# Patient Record
Sex: Female | Born: 1980 | Race: White | Hispanic: Yes | Marital: Married | Smoking: Never smoker
Health system: Southern US, Community
[De-identification: ages and names within clinical notes are randomized; demographics above are authoritative.]

## PROBLEM LIST (undated history)

## (undated) DIAGNOSIS — B977 Papillomavirus as the cause of diseases classified elsewhere: Secondary | ICD-10-CM

## (undated) HISTORY — DX: Papillomavirus as the cause of diseases classified elsewhere: B97.7

---

## 2002-07-18 ENCOUNTER — Encounter: Payer: Self-pay | Admitting: Emergency Medicine

## 2002-07-18 ENCOUNTER — Emergency Department (HOSPITAL_COMMUNITY): Admission: EM | Admit: 2002-07-18 | Discharge: 2002-07-18 | Payer: Self-pay | Admitting: Emergency Medicine

## 2002-07-23 ENCOUNTER — Emergency Department (HOSPITAL_COMMUNITY): Admission: EM | Admit: 2002-07-23 | Discharge: 2002-07-24 | Payer: Self-pay | Admitting: Emergency Medicine

## 2004-05-20 ENCOUNTER — Inpatient Hospital Stay (HOSPITAL_COMMUNITY): Admission: AD | Admit: 2004-05-20 | Discharge: 2004-05-20 | Payer: Self-pay | Admitting: Obstetrics & Gynecology

## 2004-08-20 ENCOUNTER — Ambulatory Visit (HOSPITAL_COMMUNITY): Admission: RE | Admit: 2004-08-20 | Discharge: 2004-08-20 | Payer: Self-pay | Admitting: Obstetrics & Gynecology

## 2004-08-25 ENCOUNTER — Ambulatory Visit (HOSPITAL_COMMUNITY): Admission: RE | Admit: 2004-08-25 | Discharge: 2004-08-25 | Payer: Self-pay | Admitting: *Deleted

## 2004-08-25 ENCOUNTER — Ambulatory Visit: Payer: Self-pay | Admitting: *Deleted

## 2004-10-20 ENCOUNTER — Ambulatory Visit (HOSPITAL_COMMUNITY): Admission: RE | Admit: 2004-10-20 | Discharge: 2004-10-20 | Payer: Self-pay | Admitting: *Deleted

## 2004-10-22 ENCOUNTER — Other Ambulatory Visit: Admission: RE | Admit: 2004-10-22 | Discharge: 2004-10-22 | Payer: Self-pay | Admitting: Obstetrics and Gynecology

## 2004-10-22 ENCOUNTER — Encounter (INDEPENDENT_AMBULATORY_CARE_PROVIDER_SITE_OTHER): Payer: Self-pay | Admitting: Specialist

## 2004-10-22 ENCOUNTER — Ambulatory Visit: Payer: Self-pay | Admitting: Family Medicine

## 2004-11-05 ENCOUNTER — Ambulatory Visit: Payer: Self-pay | Admitting: Family Medicine

## 2005-01-15 ENCOUNTER — Ambulatory Visit (HOSPITAL_COMMUNITY): Admission: RE | Admit: 2005-01-15 | Discharge: 2005-01-15 | Payer: Self-pay | Admitting: *Deleted

## 2005-01-15 ENCOUNTER — Ambulatory Visit: Payer: Self-pay | Admitting: *Deleted

## 2005-01-19 ENCOUNTER — Ambulatory Visit: Payer: Self-pay | Admitting: Family Medicine

## 2005-01-20 ENCOUNTER — Inpatient Hospital Stay (HOSPITAL_COMMUNITY): Admission: AD | Admit: 2005-01-20 | Discharge: 2005-01-24 | Payer: Self-pay | Admitting: Obstetrics and Gynecology

## 2005-01-20 ENCOUNTER — Ambulatory Visit: Payer: Self-pay | Admitting: Obstetrics & Gynecology

## 2005-01-21 ENCOUNTER — Encounter (INDEPENDENT_AMBULATORY_CARE_PROVIDER_SITE_OTHER): Payer: Self-pay | Admitting: *Deleted

## 2005-01-25 DIAGNOSIS — B977 Papillomavirus as the cause of diseases classified elsewhere: Secondary | ICD-10-CM

## 2005-01-25 HISTORY — DX: Papillomavirus as the cause of diseases classified elsewhere: B97.7

## 2005-03-11 ENCOUNTER — Ambulatory Visit: Payer: Self-pay | Admitting: Family Medicine

## 2005-04-05 ENCOUNTER — Emergency Department (HOSPITAL_COMMUNITY): Admission: EM | Admit: 2005-04-05 | Discharge: 2005-04-05 | Payer: Self-pay | Admitting: Pediatrics

## 2005-06-17 ENCOUNTER — Ambulatory Visit: Payer: Self-pay | Admitting: Obstetrics and Gynecology

## 2005-06-17 ENCOUNTER — Encounter: Payer: Self-pay | Admitting: Obstetrics and Gynecology

## 2005-06-30 ENCOUNTER — Encounter (INDEPENDENT_AMBULATORY_CARE_PROVIDER_SITE_OTHER): Payer: Self-pay | Admitting: *Deleted

## 2005-06-30 ENCOUNTER — Ambulatory Visit: Payer: Self-pay | Admitting: Obstetrics and Gynecology

## 2005-07-01 ENCOUNTER — Other Ambulatory Visit: Admission: RE | Admit: 2005-07-01 | Discharge: 2005-07-01 | Payer: Self-pay | Admitting: Obstetrics and Gynecology

## 2005-07-14 ENCOUNTER — Ambulatory Visit: Payer: Self-pay | Admitting: Obstetrics & Gynecology

## 2006-11-27 LAB — CONVERTED CEMR LAB: Pap Smear: NORMAL

## 2007-01-01 ENCOUNTER — Emergency Department (HOSPITAL_COMMUNITY): Admission: EM | Admit: 2007-01-01 | Discharge: 2007-01-01 | Payer: Self-pay | Admitting: Emergency Medicine

## 2007-06-22 ENCOUNTER — Ambulatory Visit: Payer: Self-pay | Admitting: Nurse Practitioner

## 2007-06-22 DIAGNOSIS — K2990 Gastroduodenitis, unspecified, without bleeding: Secondary | ICD-10-CM

## 2007-06-22 DIAGNOSIS — K2970 Gastritis, unspecified, without bleeding: Secondary | ICD-10-CM | POA: Insufficient documentation

## 2007-06-23 ENCOUNTER — Ambulatory Visit: Payer: Self-pay | Admitting: *Deleted

## 2009-12-12 ENCOUNTER — Emergency Department (HOSPITAL_COMMUNITY): Admission: EM | Admit: 2009-12-12 | Discharge: 2009-12-12 | Payer: Self-pay | Admitting: Emergency Medicine

## 2010-02-15 ENCOUNTER — Encounter: Payer: Self-pay | Admitting: *Deleted

## 2010-06-12 NOTE — Group Therapy Note (Signed)
Gabriella Schultz, GREENLY NO.:  [id number]   MEDICAL RECORD NO.:  [id number]          PATIENT TYPE:  WOC   LOCATION:  [facility]                   FACILITY:  [facility]   PHYSICIAN:  Argentina Donovan, MD        DATE OF BIRTH:  07-17-1980   DATE OF SERVICE:  06/30/2005                                    CLINIC NOTE   Patient is a 30 year old Hispanic female who was scheduled for LEEP  conization of the cervix after a previous vision, consults, consulting her  and having her watch a moving and discussing the possible side effects of  the procedure.  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   The procedure was done ___________ with the patient in a dorsal lithotomy  position.  The insulated speculum was inserted into the vagina and the  cervix placed in the mid portion of the visual field.  The lesion on the  cervix is extremely small on the upper lip, measuring a few millimeters in  diameter.  I thought this would be easy to do with a 1 x 1 cm loop.  Using a  blending 1 current of 58, a LEEP specimen was taken.  The area around the  specimen area was then re-coagulated with hot cautery measured at 50 watts.  There was no bleeding noted.  The speculum was removed, and the patient was  discharged.   Pending the pathology report, she will return in 2 weeks.  We have counseled  her to avoid tampons and sex until that time.           ______________________________  Argentina Donovan, MD     PR/MEDQ  D:  06/30/2005  T:  07/01/2005  Job:  [id number]

## 2010-06-12 NOTE — Op Note (Signed)
NAMELABERTA, WILBON       ACCOUNT NO.:  [id number]   MEDICAL RECORD NO.:  [id number]          PATIENT TYPE:  INP   LOCATION:  9371                          [facility]   PHYSICIAN:  Lesly Dukes, M.D. DATE OF BIRTH:  18-Oct-1980   DATE OF PROCEDURE:  01/21/2005  DATE OF DISCHARGE:                                 OPERATIVE REPORT   PREOPERATIVE DIAGNOSIS:  A 30 year old, gravida 2, para 0-0-1-0, at 40 weeks  and 6 days estimated gestational age with failure to progress.   POSTOPERATIVE DIAGNOSIS:  A 30 year old, gravida 2, para 0-0-1-0, at 40  weeks and 6 days estimated gestational age with failure to progress, occiput  transverse on the right side of the maternal pelvis and uterine atony.   OPERATION/PROCEDURE:  Primary low flap transverse cesarean section.   SURGEON:  Lesly Dukes, M.D.   ASSISTANT:  Towana Badger, M.D.   ANESTHESIA:  Epidural.   SPECIMENS:  Placenta.   ESTIMATED BLOOD LOSS:  800 mL.   COMPLICATIONS:  None.   FINDINGS:  A viable infant, vertex, clear fluid.  Grossly normal uterus,  ovaries and fallopian tube.  NICU team at delivery.   DESCRIPTION OF PROCEDURE:  After informed consent was obtained, the patient  was taken to the operating room where epidural anesthesia was found to be  adequate.  The patient was placed in the dorsal supine position with a  leftward tilt and prepared and draped in the normal sterile fashion.   A Pfannenstiel skin incision was made with the scalpel and carried down to  the fascia.  The fascia was incised in the midline and this incision was  extended bilaterally with the Mayo scissors.  The superior and inferior  aspects of the fascial incision were grasped with Kocher clamps, tented up,  and dissected off sharply and bluntly from underlying layers of rectus  muscles.  The rectus muscles were separated in the midline.  The peritoneum  was entered identified, tented up and entered sharply with Metzenbaum  scissors.  This incision was extended superiorly and inferiorly with good  visualization of the bladder.  The bladder blade was inserted.  The  vesicouterine peritoneum was identified, tented up and entered sharply with  the Metzenbaum scissors.  This incision was extended bilaterally and the  bladder flap was created digitally.  Bladder blade was reinserted.  The  uterine incision was made in a transverse fashion in the lower uterine  segment with a scalpel and extended bluntly, superiorly and laterally.  The  baby's head was delivered atraumatically and nose and mouth were suctioned.  The rest of the baby's body delivered easily.  The cord was clamped and cut  and the baby was handed off to the waiting pediatrician.  Cord blood was  sent for type and screen.  Placenta delivered manually and the uterus was  cleared of all clots and debris.  There was significant uterine atony and  uterus was removed from the abdomen and placed on tension. Vigorous fundal  massage and Pitocin was infused.  The uterine incision was closed with 0  Vicryl in a running locked fashion.  There  was still uterine atony.  Hemabate 250 mcg injected into the uterus and 1000 mcg of Cytotec was placed  in the rectum which helped aid in __________ uterine retraction.  Uterus was  returned to the abdomen and noted to be hemostatic off tension.  There was  very little bleeding vaginally.  At this time it was decided to close.  The  rectus muscles and peritoneum were noted to be hemostatic.  The fascia was  closed with 0 Vicryl in a running fashion.  The subcutaneous tissue was  copiously irrigated and all layers were hemostatic.  The skin was closed  with staples and a bandage was placed on the abdomen.  The patient tolerated  the procedure well.  Sponge, lab, instrument, and needle counts were x2.  The patient went to the recovery room in stable condition.           ______________________________  Lesly Dukes,  M.D.     KHL/MEDQ  D:  01/21/2005  T:  01/22/2005  Job:  [id number]

## 2010-06-12 NOTE — Discharge Summary (Signed)
Gabriella Schultz, Gabriella Schultz       ACCOUNT NO.:  [id number]   MEDICAL RECORD NO.:  [id number]          PATIENT TYPE:  WOC   LOCATION:  W[facility]                          FACILITY:  [facility]   PHYSICIAN:  Lesly Dukes, M.D. DATE OF BIRTH:  Apr 21, 1980   DATE OF ADMISSION:  01/19/2005  DATE OF DISCHARGE:  01/24/2005                                 DISCHARGE SUMMARY   DISCHARGE DIAGNOSES:  1.  Term pregnancy, delivered.  2.  Preeclampsia.  3.  Postpartum fever.   OPERATION/PROCEDURE:  Primary low transverse cesarean section, January 21, 2005.   DISCHARGE MEDICATIONS:  1.  Percocet one to two q.4h. p.r.n. severe pain #30 with no refill.  2.  Ibuprofen 600 mg q.4-6h. p.r.n. for pain.  3.  Micronor one p.o. daily to start February 07, 2005.  4.  Prenatal vitamins one p.o. daily for six weeks.   FOLLOW UP:  At [facility] in six weeks.   BRIEF HISTORY AND HOSPITAL COURSE:  Gabriella Schultz is a 30 year old,  gravida 2, para 1-0-1-1, admitted January 20, 2005 at 40 weeks and 5 days  in very early labor with high blood pressure and positive PIH labs.  She was  admitted for augmentation with Pitocin and magnesium sulfate drip.  She was  laboring with progression to cervical dilatation of 5-6 cm even on high-dose  Pitocin. She failed to progress and was taken for cesarean section January 21, 2005.  Postpartum she was admitted to the [facility] on magnesium sulfate  therapy which was discontinued on postoperative day #2 after significant  diuresis and stable blood pressures.  On postoperative day #1, she had a  fever to 101 and was started on gentamicin and clindamycin and ampicillin  which were all discontinued.  After two days of treatment, she was  discharged in good and stable condition on January 24, 2005, having been  afebrile for greater than 24 hours and off antibiotics for 12 hours. She had  some mild abdominal tenderness which went away with Percocet and was given  precautions and  understood that if febrile, increased pain, or pain that did  not go away with pain medications, or abdominal symptoms like vomiting, she  was to return to the [facility].  She understands.  Staples were removed prior to  discharge.  She is to follow up as above.      Ace Gins, MD    ______________________________  Lesly Dukes, M.D.    JS/MEDQ  D:  01/24/2005  T:  01/25/2005  Job:  [id number]

## 2010-06-12 NOTE — Group Therapy Note (Signed)
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  We have talked to her about  the treatments.  She has had one five months old base cesarean section and  has never had an abnormal Pap smear as far as she knew until she was  pregnant, which I think gives concern considering that she is CIN-3 at this  point.  The cervix is examined and she is found to have a very tiny lesion  which I think could easily be handled with a rather shallow small LEEP.  We  are going to show the patient the film being translated by [place], and  schedule her for a LEEP biopsy in the near future.   The patient is taking Depo-Provera.  She is a nonsmoker.           ______________________________  Argentina Donovan, MD     PR/MEDQ  D:  06/17/2005  T:  06/18/2005  Job:  [id number]

## 2010-06-12 NOTE — Group Therapy Note (Signed)
NAMEBLONNIE, MASKE NO.:  [id number]   MEDICAL RECORD NO.:  [id number]          PATIENT TYPE:  WOC   LOCATION:  [facility]                   [facility]   PHYSICIAN:  Elsie Lincoln, MD      DATE OF BIRTH:  July 30, 1980   DATE OF SERVICE:  07/14/2005                                    CLINIC NOTE   The patient is a 30 year old female who presented for care for follow up of  her LEEP. She had a LEEP done on June 30, 2005, for CIN-3. 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 The patient has had some cramping since.   PHYSICAL EXAMINATION:  On physical exam the cervix looks like it is healing  well. It is painful when I put the speculum, but the speculum was painful  before the procedure. She denies any fevers or abnormal-smelling discharge.   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 The patient needs  every six month Pap smear for a year, and then yearly Pap smears after that.           ______________________________  Elsie Lincoln, MD     KL/MEDQ  D:  07/14/2005  T:  07/14/2005  Job:  [id number]

## 2010-11-02 LAB — COMPREHENSIVE METABOLIC PANEL
ALT: 50 — ABNORMAL HIGH
AST: 49 — ABNORMAL HIGH
Albumin: 4.4
Alkaline Phosphatase: 60
BUN: 7
CO2: 27
Calcium: 9.6
Chloride: 103
Creatinine, Ser: 0.43
GFR calc Af Amer: >60
GFR calc non Af Amer: >60
Glucose, Bld: 104 — ABNORMAL HIGH
Potassium: 3.9
Sodium: 139
Total Bilirubin: 0.8
Total Protein: 7.7

## 2010-11-02 LAB — DIFFERENTIAL
Basophils Absolute: 0.1
Basophils Relative: 0
Eosinophils Absolute: 0.1 — ABNORMAL LOW
Eosinophils Relative: 0
Lymphocytes Relative: 12
Lymphs Abs: 1.8
Monocytes Absolute: 0.8
Monocytes Relative: 6
Neutro Abs: 12.2 — ABNORMAL HIGH
Neutrophils Relative %: 82 — ABNORMAL HIGH

## 2010-11-02 LAB — CBC
HCT: 40.6
Hemoglobin: 13.5
MCHC: 33.2
MCV: 86.8
Platelets: 293
RBC: 4.68
RDW: 14.1
WBC: 14.9 — ABNORMAL HIGH

## 2010-11-02 LAB — LIPASE, BLOOD: Lipase: 27

## 2013-09-23 ENCOUNTER — Ambulatory Visit (INDEPENDENT_AMBULATORY_CARE_PROVIDER_SITE_OTHER): Payer: Self-pay | Admitting: Internal Medicine

## 2013-09-23 VITALS — BP 108/70 | HR 77 | Temp 98.4°F | Resp 18 | Ht 59.0 in | Wt 141.0 lb

## 2013-09-23 DIAGNOSIS — R12 Heartburn: Secondary | ICD-10-CM

## 2013-09-23 DIAGNOSIS — J351 Hypertrophy of tonsils: Secondary | ICD-10-CM

## 2013-09-23 LAB — POCT CBC
Granulocyte percent: 63.3 %G (ref 37–80)
HCT, POC: 39.1 % (ref 37.7–47.9)
HEMOGLOBIN: 12.3 g/dL (ref 12.2–16.2)
LYMPH, POC: 2.7 (ref 0.6–3.4)
MCH, POC: 27 pg (ref 27–31.2)
MCHC: 31.4 g/dL — AB (ref 31.8–35.4)
MCV: 86.1 fL (ref 80–97)
MID (CBC): 0.5 (ref 0–0.9)
MPV: 7.6 fL (ref 0–99.8)
PLATELET COUNT, POC: 314 10*3/uL (ref 142–424)
POC GRANULOCYTE: 5.6 (ref 2–6.9)
POC LYMPH PERCENT: 30.6 %L (ref 10–50)
POC MID %: 6.1 % (ref 0–12)
RBC: 4.54 M/uL (ref 4.04–5.48)
RDW, POC: 15.1 %
WBC: 8.8 10*3/uL (ref 4.6–10.2)

## 2013-09-23 LAB — GLUCOSE, POCT (MANUAL RESULT ENTRY): POC GLUCOSE: 113 mg/dL — AB (ref 70–99)

## 2013-09-23 MED ORDER — PREDNISONE 10 MG PO TABS: ORAL_TABLET | ORAL | Status: AC

## 2013-09-23 MED ORDER — LANSOPRAZOLE 30 MG PO CPDR: 30.0000 mg | DELAYED_RELEASE_CAPSULE | Freq: Every day | ORAL | Status: DC

## 2013-09-23 MED ORDER — RANITIDINE HCL 300 MG PO TABS: 300.0000 mg | ORAL_TABLET | Freq: Every day | ORAL | Status: DC

## 2013-09-23 NOTE — Progress Notes (Signed)
Subjective:    Patient ID: Gabriella Schultz, female    DOB: [birth year], 33 y.o.   MRN: [id number]  HPI Pt presents to clinic with sore throat in the am that gets only slightly better during the day.  She has had heartburn really bad for the last month - she had it in the past but had gotten rid of it with weight loss but then she gained weight and the heartburn is back - it only affects her when she lays down at night.  She feels like something is stuck in her throat but she only has minor congestion without knowledge of a lot of PND.  She does not feel sick.  Her husband says she snores but he has never told her she stops breathing.  She is having no trouble breathing at this time.  Her throat is making her talk funny esp at work when she has to talk a lot.  OTC - zrytec  Review of Systems  Constitutional: Negative for fever and chills.  HENT: Positive for sore throat. Negative for congestion.   Respiratory: Positive for cough.   Allergic/Immunologic: Positive for environmental allergies.       Objective:   Physical Exam  Vitals reviewed. Constitutional: She is oriented to person, place, and time. She appears well-developed and well-nourished.  HENT:  Head: Normocephalic and atraumatic.  Right Ear: Hearing, tympanic membrane, external ear and ear canal normal.  Left Ear: Hearing, tympanic membrane, external ear and ear canal normal.  Nose: Nose normal.  Mouth/Throat: Posterior oropharyngeal edema (kissing tonsils without erythema or exudate) present.  Eyes: Conjunctivae are normal.  Neck: Normal range of motion.  Cardiovascular: Normal rate, regular rhythm and normal heart sounds.   No murmur heard. Pulmonary/Chest: Effort normal and breath sounds normal. She has no wheezes.  Lymphadenopathy:       Head (right side): No preauricular and no occipital adenopathy present.       Head (left side): No preauricular and no occipital adenopathy present.    She has cervical adenopathy  (enlarged by no TTP).       Right cervical: Superficial cervical adenopathy present.       Left cervical: Superficial cervical adenopathy present.       Right: No inguinal adenopathy present.       Left: No inguinal adenopathy present.  Neurological: She is alert and oriented to person, place, and time.  Skin: Skin is warm and dry.  Psychiatric: She has a normal mood and affect. Her behavior is normal. Judgment and thought content normal.      Assessment & Plan:  Enlarged tonsils - Plan: POCT CBC, POCT glucose (manual entry), predniSONE (DELTASONE) 10 MG tablet  Heartburn - Plan: ranitidine (ZANTAC) 300 MG tablet, lansoprazole (PREVACID) 30 MG capsule  Pt has very enlarged tonsils - unsure if they are acutely enlarged due to reflux or if they are chronically enlarged or a combination of both. She will talk with her husband about her snoring and possible apnea.  She will recheck with me in 4 days as long as she is improving to make sure she has improved with prednisone and if not she will need referral for ENT evaluation.  We will treat with reflux medication for the next month if she improves with the prednisone.  Benny Lennert PA-C  [facility] 09/23/2013 4:53 PM  Evaluated this patient with PA Chanan Detwiler and agree with diagnosis and plan. Robert P. Sandria Bales.D.

## 2013-09-26 ENCOUNTER — Ambulatory Visit: Payer: Self-pay | Attending: Internal Medicine

## 2013-11-19 ENCOUNTER — Other Ambulatory Visit: Payer: Self-pay | Admitting: Physician Assistant

## 2013-11-19 DIAGNOSIS — J351 Hypertrophy of tonsils: Secondary | ICD-10-CM

## 2013-11-20 NOTE — Telephone Encounter (Signed)
Gabriella Schultz had wanted pt to be back in touch w/her concerning status/need for referral to ENT for enlarged tonsils/reflux. LMOM to CB. Has reflux resolved w/prevacid?

## 2013-11-21 NOTE — Telephone Encounter (Signed)
LMOM to CB. 

## 2013-11-26 NOTE — Telephone Encounter (Signed)
Spoke w/pt who stated she would call me back later today to discuss since she is working now, but she reported that her acid reflux has improved during the day, but still bothers her at night. She tries to eat a light dinner. She stated that her tonsils are still enlarged.

## 2013-11-30 NOTE — Telephone Encounter (Signed)
Pt has not called back.LMOM for pt asking for her to call back.

## 2013-12-03 NOTE — Telephone Encounter (Signed)
Pt came into 102 and advised that her acid is gone and she doesn't need a refill, but she is still having problem w/enlarged tonsils. She would like a referral to ENT IF it is part of [facility] bc she has 100% discount through [facility] [employer] assist. I will put in referral w/this note. According to website, [facility] is [facility]. Lupita LeashDonna, can you please make sure you refer pt there and make sure that they will accept the [facility] 100% Financial Aid? Clare GandySarah, FYI.

## 2014-03-08 ENCOUNTER — Ambulatory Visit: Payer: Self-pay | Admitting: Family Medicine

## 2014-03-18 ENCOUNTER — Ambulatory Visit: Payer: Self-pay | Attending: Family Medicine | Admitting: Family Medicine

## 2014-03-18 ENCOUNTER — Encounter: Payer: Self-pay | Admitting: Family Medicine

## 2014-03-18 VITALS — BP 129/88 | HR 63 | Temp 98.1°F | Resp 16 | Ht 60.0 in | Wt 140.0 lb

## 2014-03-18 DIAGNOSIS — Z01419 Encounter for gynecological examination (general) (routine) without abnormal findings: Secondary | ICD-10-CM

## 2014-03-18 DIAGNOSIS — Z8349 Family history of other endocrine, nutritional and metabolic diseases: Secondary | ICD-10-CM | POA: Insufficient documentation

## 2014-03-18 DIAGNOSIS — Z124 Encounter for screening for malignant neoplasm of cervix: Secondary | ICD-10-CM | POA: Insufficient documentation

## 2014-03-18 DIAGNOSIS — R7309 Other abnormal glucose: Secondary | ICD-10-CM | POA: Insufficient documentation

## 2014-03-18 DIAGNOSIS — J351 Hypertrophy of tonsils: Secondary | ICD-10-CM | POA: Insufficient documentation

## 2014-03-18 DIAGNOSIS — K219 Gastro-esophageal reflux disease without esophagitis: Secondary | ICD-10-CM | POA: Insufficient documentation

## 2014-03-18 DIAGNOSIS — Z131 Encounter for screening for diabetes mellitus: Secondary | ICD-10-CM

## 2014-03-18 DIAGNOSIS — Z83438 Family history of other disorder of lipoprotein metabolism and other lipidemia: Secondary | ICD-10-CM | POA: Insufficient documentation

## 2014-03-18 DIAGNOSIS — Z224 Carrier of infections with a predominantly sexual mode of transmission: Secondary | ICD-10-CM | POA: Insufficient documentation

## 2014-03-18 LAB — TSH: TSH: 1.338 u[IU]/mL (ref 0.350–4.500)

## 2014-03-18 LAB — GLUCOSE, POCT (MANUAL RESULT ENTRY): POC GLUCOSE: 97 mg/dL (ref 70–99)

## 2014-03-18 LAB — POCT GLYCOSYLATED HEMOGLOBIN (HGB A1C): Hemoglobin A1C: 6.2

## 2014-03-18 MED ORDER — RANITIDINE HCL 300 MG PO TABS: 300.0000 mg | ORAL_TABLET | Freq: Every day | ORAL | Status: AC

## 2014-03-18 NOTE — Assessment & Plan Note (Addendum)
F/u fasting for lipids.

## 2014-03-18 NOTE — Progress Notes (Signed)
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

## 2014-03-18 NOTE — Assessment & Plan Note (Signed)
cervical cancer screening: pap smear done today.

## 2014-03-18 NOTE — Progress Notes (Signed)
   Subjective:    Patient ID: Gabriella Schultz, female    DOB: 02/18/1980, 34 y.o.   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 Swollen tonsils: x 6 months. No fever, cough, ear ache.  Intermittent soreness. Hx of GERD on zantac most recently. Also treated with PPI in the past.   2. Due for pap: has hx of HPV positive pap and colposcopy. Currently on menstrual period. Has cramping during period but no other times.    Soc Hx: non smoker  Surg Hx: s/p C-section in  Fam Hx: HLD in mother and sister  Review of Systems As per HPI     Objective:   Physical Exam BP 129/88 mmHg  Pulse 63  Temp(Src) 98.1 F (36.7 C)  Resp 16  Ht 5' (1.524 m)  Wt 140 lb (63.504 kg)  BMI 27.34 kg/m2  SpO2 100%  LMP 03/18/2014 General appearance: alert, cooperative and no distress Head: Normocephalic, without obvious abnormality, atraumatic Eyes: conjunctivae/corneas clear. PERRL, EOM's intact.  Ears: normal TM's and external ear canals both ears Throat: normal findings: lips normal without lesions, buccal mucosa normal, gums healthy, teeth intact, non-carious, palate normal, tongue midline and normal and oropharynx pink & moist without lesions or evidence of thrush and abnormal findings: tonsillar hypertrophy 4+ Neck: mild anterior cervical adenopathy, supple, symmetrical, trachea midline and thyroid not enlarged, symmetric, no tenderness/mass/nodules Lungs: clear to auscultation bilaterally Breasts: normal appearance, no masses or tenderness, Inspection negative, No nipple retraction or dimpling, No nipple discharge or bleeding, No axillary or supraclavicular adenopathy Heart: regular rate and rhythm, S1, S2 normal, no murmur, click, rub or gallop Pelvic: cervix normal in appearance, external genitalia normal, no adnexal masses or tenderness, no cervical motion tenderness, positive  findings: vaginal discharge:  bloody, rectovaginal septum normal and uterus normal size, shape, and consistency Extremities: extremities normal, atraumatic, no cyanosis or edema Skin: Skin color, texture, turgor normal. No rashes or lesions  Lab Results  Component Value Date   HGBA1C 6.20 03/18/2014   CBG 97     Assessment & Plan:

## 2014-03-18 NOTE — Assessment & Plan Note (Addendum)
A:  Swollen tonsils: rapid strep negative.  Will send culture. Possibly due to reflux Swollen tonsils is not associated with a thyroid problem.  P: Restart zantac 300 mg at night.

## 2014-03-18 NOTE — Patient Instructions (Addendum)
Gabriella Schultz,   Thank you for coming in today. It was a pleasure meeting you. I look forward to being your primary doctor.   1. Cervical cancer screening: pap smear done today.   2. Swollen tonsils: rapid strep negative.  Will send culture. Possibly due to reflux Swollen tonsils is not associated with a thyroid problem.   Plan: Restart zantac 300 mg at night.   F/u fasting for lipids.  A1c is 6.2 close to diabetes. Start low carb diet, exercise 3-4 times weekly, aim to lose 10-15# of weight over the next 3 months.   F/u in 6 weeks for swollen tonsils. If still swollen despite zantac 300 mg nightly ENT referral.   Please apply for [facility] discount and orange card, you can also inquire if any of your medications are on the PASS (medications assistance) list.   Dr. Armen PickupFunches

## 2014-03-18 NOTE — Assessment & Plan Note (Signed)
A1c is 6.2 close to diabetes. Start low carb diet, exercise 3-4 times weekly, aim to lose 10-15# of weight over the next 3 months.

## 2014-03-19 LAB — CERVICOVAGINAL ANCILLARY ONLY
CHLAMYDIA, DNA PROBE: NEGATIVE
NEISSERIA GONORRHEA: NEGATIVE
WET PREP (BD AFFIRM): NEGATIVE
WET PREP (BD AFFIRM): NEGATIVE
Wet Prep (BD Affirm): NEGATIVE

## 2014-03-19 LAB — CYTOLOGY - PAP

## 2014-03-20 ENCOUNTER — Ambulatory Visit: Payer: Self-pay | Attending: Family Medicine

## 2014-03-20 DIAGNOSIS — Z83438 Family history of other disorder of lipoprotein metabolism and other lipidemia: Secondary | ICD-10-CM

## 2014-03-20 LAB — LIPID PANEL
CHOL/HDL RATIO: 4.3 ratio
CHOLESTEROL: 220 mg/dL — AB (ref 0–200)
HDL: 51 mg/dL (ref >=46)
LDL Cholesterol: 150 mg/dL — ABNORMAL HIGH (ref 0–99)
TRIGLYCERIDES: 95 mg/dL (ref <150)
VLDL: 19 mg/dL (ref 0–40)

## 2014-03-20 LAB — CULTURE, BETA STREP (GROUP B ONLY)

## 2014-03-21 ENCOUNTER — Encounter: Payer: Self-pay | Admitting: Family Medicine

## 2014-03-21 ENCOUNTER — Telehealth: Payer: Self-pay | Admitting: *Deleted

## 2014-03-21 NOTE — Telephone Encounter (Signed)
-----   Message from Lora PaulaJosalyn C Funches, MD sent at 03/20/2014 12:24 PM EST ----- Negative strep culture

## 2014-03-21 NOTE — Telephone Encounter (Signed)
Pt aware of lab results 

## 2014-03-21 NOTE — Telephone Encounter (Signed)
-----   Message from Lora PaulaJosalyn C Funches, MD sent at 03/19/2014  9:11 AM EST ----- Normal TSH. Normal thyroid. Normal wet prep. Neg Gc/chlam

## 2014-03-21 NOTE — Telephone Encounter (Signed)
-----   Message from Josalyn C Funches, MD sent at 03/20/2014 12:24 PM EST ----- Negative strep culture 

## 2014-03-22 ENCOUNTER — Telehealth: Payer: Self-pay | Admitting: *Deleted

## 2014-03-22 NOTE — Telephone Encounter (Signed)
Pt aware of lab results 

## 2014-03-22 NOTE — Telephone Encounter (Signed)
-----   Message from Lora PaulaJosalyn C Funches, MD sent at 03/22/2014 10:01 AM EST ----- Elevated LDL and total cholestol Patient low risk for CAD Recommend low fat, low carb diet, exercise with goal of 5-10 # weight loss

## 2014-04-10 ENCOUNTER — Ambulatory Visit: Payer: Self-pay | Attending: Family Medicine

## 2014-08-19 ENCOUNTER — Encounter: Payer: Self-pay | Admitting: Family Medicine

## 2014-08-19 ENCOUNTER — Ambulatory Visit: Payer: Self-pay | Attending: Family Medicine | Admitting: Family Medicine

## 2014-08-19 VITALS — BP 119/82 | HR 63 | Temp 98.5°F | Resp 18 | Ht 61.0 in | Wt 140.0 lb

## 2014-08-19 DIAGNOSIS — G5601 Carpal tunnel syndrome, right upper limb: Secondary | ICD-10-CM | POA: Insufficient documentation

## 2014-08-19 DIAGNOSIS — G5600 Carpal tunnel syndrome, unspecified upper limb: Secondary | ICD-10-CM | POA: Insufficient documentation

## 2014-08-19 MED ORDER — PREDNISONE 20 MG PO TABS
20.0000 mg | ORAL_TABLET | Freq: Every day | ORAL | Status: DC
Start: 2014-08-19 — End: 2017-03-26

## 2014-08-19 MED ORDER — TRAMADOL HCL 50 MG PO TABS: 50.0000 mg | ORAL_TABLET | Freq: Three times a day (TID) | ORAL | Status: DC | PRN

## 2014-08-19 NOTE — Progress Notes (Signed)
   Subjective:    Patient ID: Gabriella Schultz, female    DOB: 1980-03-10, 34 y.o.   MRN: [id number]  HPI Gabriella Schultz is a 34 year old  right handed and  was seen with the aid of the Spanish Interpreter- Celene Skeen. Complains of pain in right wrist which shoots up to her underarm and has been on going for the last one week. 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 This hurts to the point where she has a hard time turning the steering wheel of her car; used icy hot and a wrist brace with little relief. Denies dropping things does admit to numbness on the ulnar aspect of her right hand.  Past Medical History  Diagnosis Date  . HPV (human papilloma virus) infection 2007    Past Surgical History  Procedure Laterality Date  . Cesarean section      No Known Allergies  Current Outpatient Prescriptions on File Prior to Visit  Medication Sig Dispense Refill  . ranitidine (ZANTAC) 300 MG tablet Take 1 tablet (300 mg total) by mouth at bedtime. (Patient not taking: Reported on 08/19/2014) 90 tablet 1   No current facility-administered medications on file prior to visit.      Review of Systems General: negative for fever, weight loss, appetite change Eyes: no visual symptoms. ENT: no ear symptoms, no sinus tenderness, no nasal congestion or sore throat. Neck: no pain  Respiratory: no wheezing, shortness of breath, cough Cardiovascular: no chest pain, no dyspnea on exertion, no pedal edema, no orthopnea. Gastrointestinal: no abdominal pain, no diarrhea, no constipation Genito-Urinary: no urinary frequency, no dysuria, no polyuria. Hematologic: no bruising Endocrine: no cold or heat intolerance Neurological: no headaches, no seizures, no tremors, + numbness of right hand Musculoskeletal: see hpi Skin: no pruritus, no rash. Psychological: no depression, no anxiety,         Objective: Filed Vitals:   08/19/14 1021  BP: 119/82  Pulse: 63  Temp: 98.5 F (36.9 C)  TempSrc: Oral    Resp: 18  Height:  (1.549 m)  Weight: 140 lb (63.504 kg)  SpO2: 100%      Physical Exam  Constitutional: normal appearing,  Neck: normal range of motion, no thyromegaly, no JVD Cardiovascular: normal rate and rhythm, normal heart sounds, no murmurs, rub or gallop, no pedal edema Respiratory: clear to auscultation bilaterally, no wheezes, no rales, no rhonchi Abdomen: soft, not tender to palpation, normal bowel sounds, no enlarged organs Extremities: positive phalen's and tinel's sign Skin: warm and dry, no lesions. Neurological: alert, oriented x3, cranial nerves I-XII grossly intact , normal motor strength, normal sensation. Psychological: normal mood.         Assessment & Plan:  34 year old right-handed female with right carpal tunnel syndrome.  Carpal tunnel syndrome: Secondary to repetitive right hand movements at job. Advised to continue using right hand brace meanwhile I will place her on a short course of oral steroids and tramadol. Will reassess at next office visit for improvement.  This note has been created with Education officer, environmental. Any transcriptional errors are unintentional.

## 2014-08-19 NOTE — Patient Instructions (Signed)
Carpal Tunnel Release Carpal tunnel release is done to relieve the pressure on the nerves and tendons on the bottom side of your wrist.  LET YOUR CAREGIVER KNOW ABOUT:   Allergies to food or medicine.  Medicines taken, including vitamins, herbs, eyedrops, over-the-counter medicines, and creams.  Use of steroids (by mouth or creams).  Previous problems with anesthetics or numbing medicines.  History of bleeding problems or blood clots.  Previous surgery.  Other health problems, including diabetes and kidney problems.  Possibility of pregnancy, if this applies. RISKS AND COMPLICATIONS  Some problems that may happen after this procedure include:  Infection.  Damage to the nerves, arteries or tendons could occur. This would be very uncommon.  Bleeding. BEFORE THE PROCEDURE   This surgery may be done while you are asleep (general anesthetic) or may be done under a block where only your forearm and the surgical area is numb.  If the surgery is done under a block, the numbness will gradually wear off within several hours after surgery. HOME CARE INSTRUCTIONS   Have a responsible person with you for 24 hours.  Do not drive a car or use public transportation for 24 hours.  Only take over-the-counter or prescription medicines for pain, discomfort, or fever as directed by your caregiver. Take them as directed.  You may put ice on the palm side of the affected wrist.  Put ice in a plastic bag.  Place a towel between your skin and the bag.  Leave the ice on for 20 to 30 minutes, 4 times per day.  If you were given a splint to keep your wrist from bending, use it as directed. It is important to wear the splint at night or as directed. Use the splint for as long as you have pain or numbness in your hand, arm, or wrist. This may take 1 to 2 months.  Keep your hand raised (elevated) above the level of your heart as much as possible. This keeps swelling down and helps with  discomfort.  Change bandages (dressings) as directed.  Keep the wound clean and dry. SEEK MEDICAL CARE IF:   You develop pain not relieved with medications.  You develop numbness of your hand.  You develop bleeding from your surgical site.  You have an oral temperature above 102 F (38.9 C).  You develop redness or swelling of the surgical site.  You develop new, unexplained problems. SEEK IMMEDIATE MEDICAL CARE IF:   You develop a rash.  You have difficulty breathing.  You develop any reaction or side effects to medications given. Document Released: 04/03/2003 Document Revised: 04/05/2011 Document Reviewed: 11/17/2006 ExitCare Patient Information 2015 [employer]. This information is not intended to replace advice given to you by your health care provider. Make sure you discuss any questions you have with your health care provider.  

## 2014-08-19 NOTE — Progress Notes (Signed)
Discomfort in right wrist, pain goes to armpit, at level 8, described as shooting, started last Tuesday. Patient tried icy hot ointment and wrist splint but did not help.

## 2014-09-23 ENCOUNTER — Ambulatory Visit: Payer: Self-pay | Admitting: Family Medicine

## 2014-10-08 ENCOUNTER — Emergency Department (HOSPITAL_COMMUNITY)
Admission: EM | Admit: 2014-10-08 | Discharge: 2014-10-08 | Disposition: A | Discharge status: Home / Self Care | Payer: Worker's Compensation | Attending: Emergency Medicine | Admitting: Emergency Medicine

## 2014-10-08 ENCOUNTER — Encounter (HOSPITAL_COMMUNITY): Payer: Self-pay | Admitting: Emergency Medicine

## 2014-10-08 DIAGNOSIS — Y9289 Other specified places as the place of occurrence of the external cause: Secondary | ICD-10-CM | POA: Insufficient documentation

## 2014-10-08 DIAGNOSIS — W208XXA Other cause of strike by thrown, projected or falling object, initial encounter: Secondary | ICD-10-CM | POA: Insufficient documentation

## 2014-10-08 DIAGNOSIS — Z7952 Long term (current) use of systemic steroids: Secondary | ICD-10-CM | POA: Insufficient documentation

## 2014-10-08 DIAGNOSIS — Y93G1 Activity, food preparation and clean up: Secondary | ICD-10-CM | POA: Insufficient documentation

## 2014-10-08 DIAGNOSIS — Y990 Civilian activity done for income or pay: Secondary | ICD-10-CM | POA: Insufficient documentation

## 2014-10-08 DIAGNOSIS — Z8619 Personal history of other infectious and parasitic diseases: Secondary | ICD-10-CM | POA: Insufficient documentation

## 2014-10-08 DIAGNOSIS — S0990XA Unspecified injury of head, initial encounter: Secondary | ICD-10-CM | POA: Insufficient documentation

## 2014-10-08 MED ORDER — IBUPROFEN 400 MG PO TABS
600.0000 mg | ORAL_TABLET | Freq: Once | ORAL | Status: DC
Start: 2014-10-08 — End: 2014-10-08
  Filled 2014-10-08: qty 2

## 2014-10-08 NOTE — Discharge Instructions (Signed)
Conmocin cerebral (Concussion) Una conmocin es una lesin cerebral. Las causas pueden ser:  Un golpe en la cabeza.  Un movimiento rpido y brusco (sacudida) de la cabeza o el cuello. Generalmente no pone en peligro la vida. Sin embargo, puede causar problemas graves. Si sufri una conmocin cerebral antes, puede tener sntomas similares a la conmocin despus de un golpe en la cabeza. CUIDADOS EN EL HOGAR Instrucciones generales  Siga cuidadosamente las indicaciones del mdico.  Tome los medicamentos como le indic el mdico.  Solo tome los medicamentos que su mdico considere seguros.  No beba alcohol hasta que el mdico lo autorice. El alcohol y algunos medicamentos pueden hacer ms lenta la curacin. Tambin pueden ponerlo en riesgo de sufrir otras lesiones.  Si tiene dificultad para recordar las cosas, escrbalas.  Trate de hacer una cosa por vez si se distrae con facilidad. Por ejemplo, no mire televisin mientras prepara la cena.  Consulte con familiares y amigos si debe tomar decisiones importantes.  Concurra a las consultas de control con el mdico, segn las indicaciones.  Observe sus sntomas. Dgale a los dems que hagan lo mismo. En algunos casos, despus de una conmocin cerebral surgen problemas graves. Es ms probable que [employer] graves los sufran los adultos [place].  Informe a los [address], el departamento de enfermera de la escuela, el consejero escolar, Public affairs consultant acerca de su conmocin cerebral. Hgales saber lo que puede y lo que no Scientist, product/process development. Ellos debern observarlo para ver si:  Tiene ms dificultad para prestar atencin o concentrarse.  Le cuesta an ms recordar las cosas o aprender cosas nuevas.  Necesita ms tiempo que lo habitual para finalizar las tareas.  Est ms molesto (irritable) que antes.  Tampoco puede Charity fundraiser.  Tiene ms problemas que antes.  Haga reposo. Asegrese de que:  Duerme bien por la  noche.  Se va a dormir temprano.  Acustese [employer] a la misma hora aproximadamente. Trate de despertarse a la misma hora.  Descanse Administrator.  Tome una siesta cuando se sienta cansado.  Limite las actividades que requieran mucha concentracin. Estas pueden ser:  Hacer la tarea para Advice worker.  Hacer tareas relacionadas con un trabajo.  Mirar televisin.  Usar la computadora. Regreso a las [employer] a las actividades habituales gradualmente, no [place] hacer todo de Building control surveyor. Debe darles al cuerpo y el cerebro su tiempo para recuperarse.   No practique deportes ni realice otras actividades deportivas hasta que el mdico lo autorice.  Consulte a su mdico cundo puede andar en bicicleta o conducir otros vehculos o mquinas. Nunca haga estas cosas si se siente mareado.  Pregunte a su mdico cundo podr volver a la escuela o al Aleen Campi. Prevencin de otra conmocin cerebral Es muy importante que evite otra lesin cerebral, especialmente antes de que se haya recuperado. En casos raros, una nueva lesin puede causar daos cerebrales permanentes, hinchazn del cerebro o la muerte. El riesgo es mayor durante los primeros 7 a 10das despus de una lesin. Evite las lesiones:   Use el cinturn de seguridad al conducir un automvil.  Evite beber alcohol en exceso.  Evite las actividades que puedan favorecer una segunda conmocin cerebral (como al practicar deportes de contacto).  Use un casco cuando practique actividades como:  Andar en bicicleta.  Esquiar.  Andar en patineta.  Andar en patines.  Haga de su hogar un lugar ms seguro:  Retire las cosas del piso o de  las escaleras que podran hacerlo tropezar.  Coloque barras en los baos y pasamanos en las escaleras.  Ponga alfombras antideslizantes en pisos y baeras.  Mejore la iluminacin en zonas de penumbra. SOLICITE AYUDA SI:  Tiene ms dificultad para prestar atencin o  concentrarse.  Le cuesta an ms recordar las cosas o aprender cosas nuevas.  Necesita ms tiempo que lo habitual para finalizar las tareas.  Est ms molesto (irritable) que antes.  Tampoco puede Charity fundraiser.  Tiene ms problemas que antes.  Tiene problemas para mantener el equilibrio.  No logra reaccionar rpidamente cuando lo necesita. Solicite ayuda si tiene alguno de estos problemas durante ms de 2 semanas:   Dolor de cabeza que perdura (crnico).  Mareos o problemas de equilibrio.  Ganas de vomitar (nuseas).  Problemas para ver (de vista).  Sentirse afectado por ruidos o la luz ms que lo normal.  Sentir tristeza, decaimiento, sufrimiento espiritual, melancola, pesimismo o vaco (depresin).  Cambios de humor (cambios en el estado de nimo).  Sensacin de miedo o nerviosismo por lo que podra ocurrir (ansiedad).  Se siente abrumado.  Problemas de memoria.  Dificultad para concentrarse o Engineer, technical sales.  Problemas para dormir.  Cansancio permanente. SOLICITE AYUDA DE INMEDIATO SI:   Tiene dolores de cabeza intensos o estos empeoran.  Siente debilidad (aun si es solo en [place], una pierna o parte del rostro).  Tiene falta de sensibilidad (adormecimiento).  Siente que pierde el equilibrio.  No deja de vomitar.  Siente cansancio.  Uno de los centros negros del ojo (pupila) es ms grande que el otro.  Se retuerce o se sacude violentamente (tiene convulsiones).  El habla no es clara (arrastra las palabras).  Se siente ms confundido, se enoja con ms facilidad (agitacin) o est ms irritable que antes.  Tiene ms dificultad para descansar que antes.  No puede Nutritional therapist o lugares.  Siente dolor en el cuello.  Le resulta difcil despertarse.  Tiene cambios de conducta inusuales.  Se desmaya (pierde el conocimiento). ASEGRESE DE QUE:   Comprende estas instrucciones.  Controlar su afeccin.  Recibir ayuda de  inmediato si no mejora o si empeora. Document Released: 02/13/2010 Document Revised: 01/16/2013 [facility] Patient Information 2015 [place], [place]. This information is not intended to replace advice given to you by your health care provider. Make sure you discuss any questions you have with your health care provider.  Please read attached information please follow-up with primary care provider for further evaluation and management. Please return immediately if new or worsening signs or symptoms present.

## 2014-10-08 NOTE — ED Notes (Signed)
Pt sts hit in left side of head with piece of metal while at work; pt denies LOC; pt sts felt dizzy initially but denies at present

## 2014-10-08 NOTE — ED Notes (Signed)
Patient states large piece of equipment at her work hit her in the top left of her head.  Patient denies LOC, but does states that she was dizzy when it happened.   Patient denies dizziness at this time.

## 2014-10-08 NOTE — ED Provider Notes (Signed)
CSN: [id number]     Arrival date & time 10/08/14  1320 History  This chart was scribed for Gabriella Mechanic, PA-C, working with Raeford Razor, MD by Elon Spanner, ED Scribe. This patient was seen in room TR08C/TR08C and the patient's care was started at 2:56 PM.   Chief Complaint  Patient presents with  . Head Injury   The history is provided by the patient. No language interpreter was used.    HPI Comments: Gabriella Schultz is a 34 y.o. female who presents to the Emergency Department complaining of a head injury onset this morning.  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  She denies LOC but reports brief, transient dizziness that is currently absent.  There is some continuing, mild-moderate pain on the left-sided head.  She denies LOC, confusion, nausea, vomiting, extremity numbness/weakness, memory loss/lapses.   PCP: none Past Medical History  Diagnosis Date  . HPV (human papilloma virus) infection 2007   Past Surgical History  Procedure Laterality Date  . Cesarean section     Family History  Problem Relation Age of Onset  . Hyperlipidemia Mother   . Hyperlipidemia Sister    Social History  Substance Use Topics  . Smoking status: Never Smoker   . Smokeless tobacco: None  . Alcohol Use: Yes     Comment: ocassional   OB History    No data available     Review of Systems  All other systems reviewed and are negative.   Allergies  Review of patient's allergies indicates no known allergies.  Home Medications   Prior to Admission medications   Medication Sig Start Date End Date Taking? Authorizing Provider  predniSONE (DELTASONE) 20 MG tablet Take 1 tablet (20 mg total) by mouth daily with breakfast. 08/19/14   Jaclyn Shaggy, MD  ranitidine (ZANTAC) 300 MG tablet Take 1 tablet (300 mg total) by mouth at bedtime. Patient not taking: Reported on  08/19/2014 03/18/14   Dessa Phi, MD  traMADol (ULTRAM) 50 MG tablet Take 1 tablet (50 mg total) by mouth every 8 (eight) hours as needed. 08/19/14   Jaclyn Shaggy, MD   BP 106/72 mmHg  Pulse 63  Temp(Src) 98.3 F (36.8 C) (Oral)  Resp 18  Ht 5\' 1"  (1.549 m)  Wt 140 lb (63.504 kg)  BMI 26.47 kg/m2  SpO2 100% Physical Exam  Constitutional: She is oriented to person, place, and time. She appears well-developed and well-nourished. No distress.  HENT:  Head: Normocephalic and atraumatic.  No signs of trauma.  nonTTP  Eyes: Conjunctivae and EOM are normal.  Neck: Normal range of motion. Neck supple. No JVD present. No tracheal deviation present.  Cardiovascular: Normal rate.   Pulmonary/Chest: Effort normal. No respiratory distress.  Musculoskeletal: Normal range of motion.  Lymphadenopathy:    She has no cervical adenopathy.  Neurological: She is alert and oriented to person, place, and time. She has normal strength. She is not disoriented. She displays no atrophy and no tremor. No cranial nerve deficit or sensory deficit. She exhibits normal muscle tone. She displays no seizure activity. Coordination and gait normal. GCS eye subscore is 4. GCS verbal subscore is 5. GCS motor subscore is 6.  GCS 15.    Skin: Skin is warm and dry.  Psychiatric: She has a normal mood and affect. Her behavior is normal.  Nursing note and  vitals reviewed.   ED Course  Procedures (including critical care time)  DIAGNOSTIC STUDIES: Oxygen Saturation is 100% on RA, normal by my interpretation.    COORDINATION OF CARE:    Labs Review Labs Reviewed - No data to display  Imaging Review No results found. I have personally reviewed and evaluated these images and lab results as part of my medical decision-making.   EKG Interpretation None      MDM   Final diagnoses:  Head injury, initial encounter    Labs:  Imaging:  Therapeutics:  Assessment/Plan: Patient's presentation likely  represents minor trauma to the head. She has no significant findings on physical exam, she does not meet head CT requirements, this is highly unlikely to be any intracranial acute abnormality. Patient is well-appearing in no acute distress, neurologically intact. Patient may use ibuprofen for pain and should f/u with PCP.  Discussed return precautions include vomiting, weakness, LOC.    Discharge Meds:   I personally performed the services described in this documentation, which was scribed in my presence. The recorded information has been reviewed and is accurate.    Gabriella Mechanic, PA-C 10/08/14 1532  Raeford Razor, MD 10/16/14 [phone]

## 2017-01-06 ENCOUNTER — Encounter (HOSPITAL_COMMUNITY): Payer: Self-pay | Admitting: Emergency Medicine

## 2017-01-06 ENCOUNTER — Other Ambulatory Visit: Payer: Self-pay

## 2017-01-06 DIAGNOSIS — R1013 Epigastric pain: Secondary | ICD-10-CM | POA: Insufficient documentation

## 2017-01-06 DIAGNOSIS — K8020 Calculus of gallbladder without cholecystitis without obstruction: Secondary | ICD-10-CM | POA: Insufficient documentation

## 2017-01-06 DIAGNOSIS — Z79899 Other long term (current) drug therapy: Secondary | ICD-10-CM | POA: Insufficient documentation

## 2017-01-06 LAB — URINALYSIS, ROUTINE W REFLEX MICROSCOPIC
Bilirubin Urine: NEGATIVE
Glucose, UA: NEGATIVE mg/dL
HGB URINE DIPSTICK: NEGATIVE
Ketones, ur: NEGATIVE mg/dL
Leukocytes, UA: NEGATIVE
Nitrite: NEGATIVE
PROTEIN: NEGATIVE mg/dL
SPECIFIC GRAVITY, URINE: 1.016 (ref 1.005–1.030)
pH: 7 (ref 5.0–8.0)

## 2017-01-06 LAB — CBC
HCT: 37.3 % (ref 36.0–46.0)
HEMOGLOBIN: 11.9 g/dL — AB (ref 12.0–15.0)
MCH: 27.3 pg (ref 26.0–34.0)
MCHC: 31.9 g/dL (ref 30.0–36.0)
MCV: 85.6 fL (ref 78.0–100.0)
PLATELETS: 342 10*3/uL (ref 150–400)
RBC: 4.36 MIL/uL (ref 3.87–5.11)
RDW: 14.7 % (ref 11.5–15.5)
WBC: 9.3 10*3/uL (ref 4.0–10.5)

## 2017-01-06 LAB — I-STAT BETA HCG BLOOD, ED (MC, WL, AP ONLY)

## 2017-01-06 MED ORDER — ONDANSETRON 4 MG PO TBDP
4.0000 mg | ORAL_TABLET | Freq: Once | ORAL | Status: AC | PRN
  Administered 2017-01-06: 4 mg via ORAL
  Filled 2017-01-06: qty 1

## 2017-01-06 NOTE — ED Triage Notes (Signed)
Pt c/o 10/10 abd pain that started like one hour ago with nausea and vomiting. No fever or chills.

## 2017-01-07 ENCOUNTER — Emergency Department (HOSPITAL_COMMUNITY): Payer: Self-pay

## 2017-01-07 ENCOUNTER — Emergency Department (HOSPITAL_COMMUNITY)
Admission: EM | Admit: 2017-01-07 | Discharge: 2017-01-07 | Disposition: A | Discharge status: Home / Self Care | Payer: Self-pay | Attending: Emergency Medicine | Admitting: Emergency Medicine

## 2017-01-07 DIAGNOSIS — K8020 Calculus of gallbladder without cholecystitis without obstruction: Secondary | ICD-10-CM

## 2017-01-07 DIAGNOSIS — R1011 Right upper quadrant pain: Secondary | ICD-10-CM

## 2017-01-07 DIAGNOSIS — R1013 Epigastric pain: Secondary | ICD-10-CM

## 2017-01-07 LAB — COMPREHENSIVE METABOLIC PANEL
ALK PHOS: 52 U/L (ref 38–126)
ALT: 36 U/L (ref 14–54)
ANION GAP: 10 (ref 5–15)
AST: 49 U/L — ABNORMAL HIGH (ref 15–41)
Albumin: 3.9 g/dL (ref 3.5–5.0)
BILIRUBIN TOTAL: 0.3 mg/dL (ref 0.3–1.2)
BUN: 12 mg/dL (ref 6–20)
CALCIUM: 9.4 mg/dL (ref 8.9–10.3)
CO2: 26 mmol/L (ref 22–32)
Chloride: 103 mmol/L (ref 101–111)
Creatinine, Ser: 0.53 mg/dL (ref 0.44–1.00)
GFR calc non Af Amer: >60 mL/min (ref >60)
Glucose, Bld: 158 mg/dL — ABNORMAL HIGH (ref 65–99)
POTASSIUM: 3.8 mmol/L (ref 3.5–5.1)
SODIUM: 139 mmol/L (ref 135–145)
TOTAL PROTEIN: 7.5 g/dL (ref 6.5–8.1)

## 2017-01-07 LAB — LIPASE, BLOOD: Lipase: 40 U/L (ref 11–51)

## 2017-01-07 MED ORDER — HYDROCODONE-ACETAMINOPHEN 5-325 MG PO TABS: 1.0000 | ORAL_TABLET | Freq: Four times a day (QID) | ORAL | 0 refills | Status: DC | PRN

## 2017-01-07 NOTE — ED Notes (Signed)
Patient returned from ultrasound.

## 2017-01-07 NOTE — Discharge Instructions (Signed)
Hydrocodone as prescribed as needed for pain if symptoms return.  Follow up with [facility] to discuss your gallstones and possible removal of your gallbladder.  Return to the emergency department if you develop worsening pain, high fever, vomiting, or other new and concerning symptoms.

## 2017-01-07 NOTE — ED Provider Notes (Signed)
[facility]   CSN: [id number] Arrival date & time: 01/06/17  2305     History   Chief Complaint Chief Complaint  Patient presents with  . Abdominal Pain    HPI Unk PintoJudith Calvo-Gonzalez is a 36 y.o. female.  Patient is a 36 year old female with no significant past medical history presenting for evaluation of abdominal pain.  She reports several episodes over the past several months of epigastric pain that occurs after eating.  This evening, she ate tacos and soup, then shortly after developed epigastric pain, nausea, and vomiting.  This episode lasted for approximately 2 hours, but has since nearly resolved.  She denies any fevers or chills.  She denies any diarrhea or constipation.  She has not seen a doctor for these episodes until this evening.   The history is provided by the patient.  Abdominal Pain   This is a new problem. The current episode started 3 to 5 hours ago. The problem occurs constantly. The problem has been resolved. The pain is associated with eating. The pain is located in the epigastric region. The quality of the pain is cramping. The pain is moderate. Associated symptoms include nausea and vomiting. Pertinent negatives include fever. The symptoms are aggravated by eating. Nothing relieves the symptoms.    Past Medical History:  Diagnosis Date  . HPV (human papilloma virus) infection 2007    Patient Active Problem List   Diagnosis Date Noted  . Carpal tunnel syndrome 08/19/2014  . Family history of hyperlipidemia 03/18/2014  . Enlarged tonsils 03/18/2014  . Pap smear for cervical cancer screening 03/18/2014  . Elevated hemoglobin A1c 03/18/2014  . GASTRITIS 06/22/2007    Past Surgical History:  Procedure Laterality Date  . CESAREAN SECTION      OB History    No data available       Home Medications    Prior to Admission medications   Medication Sig Start Date End Date Taking? Authorizing  Provider  predniSONE (DELTASONE) 20 MG tablet Take 1 tablet (20 mg total) by mouth daily with breakfast. 08/19/14   Jaclyn ShaggyAmao, Enobong, MD  ranitidine (ZANTAC) 300 MG tablet Take 1 tablet (300 mg total) by mouth at bedtime. Patient not taking: Reported on 08/19/2014 03/18/14   Dessa PhiFunches, Josalyn, MD  traMADol (ULTRAM) 50 MG tablet Take 1 tablet (50 mg total) by mouth every 8 (eight) hours as needed. 08/19/14   Jaclyn ShaggyAmao, Enobong, MD    Family History Family History  Problem Relation Age of Onset  . Hyperlipidemia Mother   . Hyperlipidemia Sister     Social History Social History   Tobacco Use  . Smoking status: Never Smoker  Substance Use Topics  . Alcohol use: Yes    Comment: ocassional  . Drug use: No     Allergies   Patient has no known allergies.   Review of Systems Review of Systems  Constitutional: Negative for fever.  Gastrointestinal: Positive for abdominal pain, nausea and vomiting.  All other systems reviewed and are negative.    Physical Exam Updated Vital Signs BP 111/72   Pulse 65   Temp 97.8 F (36.6 C) (Oral)   Resp 14   Ht 5\' 3"  (1.6 m)   Wt 63.5 kg (140 lb)   LMP 12/20/2016   SpO2 97%   BMI 24.80 kg/m   Physical Exam  Constitutional: She is oriented to person, place, and time. She appears well-developed and well-nourished. No distress.  HENT:  Head: Normocephalic  and atraumatic.  Neck: Normal range of motion. Neck supple.  Cardiovascular: Normal rate and regular rhythm. Exam reveals no gallop and no friction rub.  No murmur heard. Pulmonary/Chest: Effort normal and breath sounds normal. No respiratory distress. She has no wheezes.  Abdominal: Soft. Bowel sounds are normal. She exhibits no distension. There is tenderness in the epigastric area. There is no rigidity, no rebound and no guarding.  There is mild tenderness in the epigastric region.  Musculoskeletal: Normal range of motion.  Neurological: She is alert and oriented to person, place, and time.    Skin: Skin is warm and dry. She is not diaphoretic.  Nursing note and vitals reviewed.    ED Treatments / Results  Labs (all labs ordered are listed, but only abnormal results are displayed) Labs Reviewed  COMPREHENSIVE METABOLIC PANEL - Abnormal; Notable for the following components:      Result Value   Glucose, Bld 158 (*)    AST 49 (*)    All other components within normal limits  CBC - Abnormal; Notable for the following components:   Hemoglobin 11.9 (*)    All other components within normal limits  URINALYSIS, ROUTINE W REFLEX MICROSCOPIC - Abnormal; Notable for the following components:   APPearance HAZY (*)    All other components within normal limits  LIPASE, BLOOD  I-STAT BETA HCG BLOOD, ED ([facility], [facility], [facility] ONLY)    EKG  EKG Interpretation None       Radiology No results found.  Procedures Procedures (including critical care time)  Medications Ordered in ED Medications  ondansetron (ZOFRAN-ODT) disintegrating tablet 4 mg (4 mg Oral Given 01/06/17 2323)     Initial Impression / Assessment and Plan / ED Course  I have reviewed the triage vital signs and the nursing notes.  Pertinent labs & imaging results that were available during my care of the patient were reviewed by me and considered in my medical decision making (see chart for details).  Workup reveals gallstones with no fever or evidence for acute cholecystitis.  Pain-free now with normal LFT's, no WBC or fever. Will discharge with pain meds, follow up with General Surgery.  Final Clinical Impressions(s) / ED Diagnoses   Final diagnoses:  RUQ pain    ED Discharge Orders    None       Geoffery Lyonselo, Hasset Chaviano, MD 01/07/17 [phone]

## 2017-01-07 NOTE — ED Notes (Signed)
Pt taken to US

## 2017-03-26 ENCOUNTER — Inpatient Hospital Stay (HOSPITAL_COMMUNITY)
Admission: EM | Admit: 2017-03-26 | Discharge: 2017-03-28 | DRG: 419 | Disposition: A | Discharge status: Home / Self Care | Payer: Medicaid Other | Attending: General Surgery | Admitting: General Surgery

## 2017-03-26 ENCOUNTER — Encounter (HOSPITAL_COMMUNITY): Payer: Self-pay | Admitting: Emergency Medicine

## 2017-03-26 ENCOUNTER — Other Ambulatory Visit: Payer: Self-pay

## 2017-03-26 ENCOUNTER — Emergency Department (HOSPITAL_COMMUNITY): Payer: Medicaid Other

## 2017-03-26 DIAGNOSIS — R1011 Right upper quadrant pain: Secondary | ICD-10-CM

## 2017-03-26 DIAGNOSIS — K812 Acute cholecystitis with chronic cholecystitis: Secondary | ICD-10-CM

## 2017-03-26 DIAGNOSIS — K8050 Calculus of bile duct without cholangitis or cholecystitis without obstruction: Secondary | ICD-10-CM

## 2017-03-26 DIAGNOSIS — K8010 Calculus of gallbladder with chronic cholecystitis without obstruction: Principal | ICD-10-CM | POA: Diagnosis present

## 2017-03-26 DIAGNOSIS — K819 Cholecystitis, unspecified: Secondary | ICD-10-CM

## 2017-03-26 LAB — CBC
HEMATOCRIT: 39.5 % (ref 36.0–46.0)
HEMOGLOBIN: 12.8 g/dL (ref 12.0–15.0)
MCH: 28.3 pg (ref 26.0–34.0)
MCHC: 32.4 g/dL (ref 30.0–36.0)
MCV: 87.2 fL (ref 78.0–100.0)
Platelets: 324 10*3/uL (ref 150–400)
RBC: 4.53 MIL/uL (ref 3.87–5.11)
RDW: 15.3 % (ref 11.5–15.5)
WBC: 13.3 10*3/uL — ABNORMAL HIGH (ref 4.0–10.5)

## 2017-03-26 LAB — URINALYSIS, ROUTINE W REFLEX MICROSCOPIC
Bilirubin Urine: NEGATIVE
GLUCOSE, UA: NEGATIVE mg/dL
HGB URINE DIPSTICK: NEGATIVE
Ketones, ur: 5 mg/dL — AB
Leukocytes, UA: NEGATIVE
Nitrite: NEGATIVE
Protein, ur: NEGATIVE mg/dL
SPECIFIC GRAVITY, URINE: 1.016 (ref 1.005–1.030)
pH: 7 (ref 5.0–8.0)

## 2017-03-26 LAB — COMPREHENSIVE METABOLIC PANEL
ALT: 60 U/L — ABNORMAL HIGH (ref 14–54)
ANION GAP: 8 (ref 5–15)
AST: 81 U/L — ABNORMAL HIGH (ref 15–41)
Albumin: 4.4 g/dL (ref 3.5–5.0)
Alkaline Phosphatase: 59 U/L (ref 38–126)
BUN: 11 mg/dL (ref 6–20)
CHLORIDE: 106 mmol/L (ref 101–111)
CO2: 26 mmol/L (ref 22–32)
Calcium: 9.3 mg/dL (ref 8.9–10.3)
Creatinine, Ser: 0.53 mg/dL (ref 0.44–1.00)
GFR calc non Af Amer: >60 mL/min (ref >60)
Glucose, Bld: 117 mg/dL — ABNORMAL HIGH (ref 65–99)
POTASSIUM: 4 mmol/L (ref 3.5–5.1)
SODIUM: 140 mmol/L (ref 135–145)
Total Bilirubin: 0.7 mg/dL (ref 0.3–1.2)
Total Protein: 7.8 g/dL (ref 6.5–8.1)

## 2017-03-26 LAB — I-STAT BETA HCG BLOOD, ED (MC, WL, AP ONLY): I-stat hCG, quantitative: <5 m[IU]/mL (ref <5)

## 2017-03-26 LAB — LIPASE, BLOOD: LIPASE: 33 U/L (ref 11–51)

## 2017-03-26 MED ORDER — SODIUM CHLORIDE 0.9 % IV SOLN
2.0000 g | Freq: Once | INTRAVENOUS | Status: AC
  Administered 2017-03-26: 2 g via INTRAVENOUS
  Filled 2017-03-26: qty 20

## 2017-03-26 MED ORDER — SODIUM CHLORIDE 0.9 % IV SOLN
Freq: Once | INTRAVENOUS | Status: AC
  Administered 2017-03-26: 23:00:00 via INTRAVENOUS

## 2017-03-26 MED ORDER — MORPHINE SULFATE (PF) 4 MG/ML IV SOLN
4.0000 mg | Freq: Once | INTRAVENOUS | Status: DC
Start: 2017-03-26 — End: 2017-03-27
  Filled 2017-03-26: qty 1

## 2017-03-26 MED ORDER — ONDANSETRON HCL 4 MG/2ML IJ SOLN
4.0000 mg | Freq: Once | INTRAMUSCULAR | Status: DC
  Filled 2017-03-26: qty 2

## 2017-03-26 MED ORDER — SODIUM CHLORIDE 0.9 % IV BOLUS (SEPSIS)
1000.0000 mL | Freq: Once | INTRAVENOUS | Status: AC
  Administered 2017-03-26: 1000 mL via INTRAVENOUS

## 2017-03-26 NOTE — ED Notes (Signed)
Surgeon at bedside.  

## 2017-03-26 NOTE — ED Notes (Signed)
Ultrasound tech at bedside

## 2017-03-26 NOTE — ED Provider Notes (Signed)
[facility]-EMERGENCY DEPT Provider Note   CSN: [id number] Arrival date & time: 03/26/17  1750     History   Chief Complaint Chief Complaint  Patient presents with  . Abdominal Pain    HPI Gabriella Schultz is a 37 y.o. female.  HPI   37 yo F with h/o gallstones here with RUQ pain. Pain is aching, throbbing, cramp like. She has had increasingly severe pain x days but now today, every time she tries to eat she has severe, unbearable pain with vomiting. She has had nausea as well. No change in bowel habits. No fevers. She did not follow--up with a [employer]. She states she took a home remedy for gallstones and "passed stones" in her stool and had relief x months, but now pain has returned. No other medical complaints.  Past Medical History:  Diagnosis Date  . HPV (human papilloma virus) infection 2007    Patient Active Problem List   Diagnosis Date Noted  . Carpal tunnel syndrome 08/19/2014  . Family history of hyperlipidemia 03/18/2014  . Enlarged tonsils 03/18/2014  . Pap smear for cervical cancer screening 03/18/2014  . Elevated hemoglobin A1c 03/18/2014  . GASTRITIS 06/22/2007    Past Surgical History:  Procedure Laterality Date  . CESAREAN SECTION      OB History    No data available       Home Medications    Prior to Admission medications   Medication Sig Start Date End Date Taking? Authorizing Provider  HYDROcodone-acetaminophen (NORCO) 5-325 MG tablet Take 1-2 tablets by mouth every 6 (six) hours as needed. 01/07/17   Geoffery Lyonselo, Douglas, MD  predniSONE (DELTASONE) 20 MG tablet Take 1 tablet (20 mg total) by mouth daily with breakfast. Patient not taking: Reported on 01/07/2017 08/19/14   Hoy RegisterNewlin, Enobong, MD  ranitidine (ZANTAC) 300 MG tablet Take 1 tablet (300 mg total) by mouth at bedtime. Patient not taking: Reported on 08/19/2014 03/18/14   Dessa PhiFunches, Josalyn, MD  traMADol (ULTRAM) 50 MG tablet Take 1 tablet (50 mg total) by mouth every 8  (eight) hours as needed. Patient not taking: Reported on 01/07/2017 08/19/14   Hoy RegisterNewlin, Enobong, MD    Family History Family History  Problem Relation Age of Onset  . Hyperlipidemia Mother   . Hyperlipidemia Sister     Social History Social History   Tobacco Use  . Smoking status: Never Smoker  Substance Use Topics  . Alcohol use: Yes    Comment: ocassional  . Drug use: No     Allergies   Patient has no known allergies.   Review of Systems Review of Systems  Constitutional: Negative for chills, fatigue and fever.  HENT: Negative for congestion and rhinorrhea.   Eyes: Negative for visual disturbance.  Respiratory: Negative for cough, shortness of breath and wheezing.   Cardiovascular: Negative for chest pain and leg swelling.  Gastrointestinal: Positive for abdominal pain, nausea and vomiting. Negative for diarrhea.  Genitourinary: Negative for dysuria and flank pain.  Musculoskeletal: Negative for neck pain and neck stiffness.  Skin: Negative for rash and wound.  Allergic/Immunologic: Negative for immunocompromised state.  Neurological: Negative for syncope, weakness and headaches.  All other systems reviewed and are negative.    Physical Exam Updated Vital Signs BP 128/89 (BP Location: Right Arm)   Pulse 77   Temp 98.4 F (36.9 C) (Oral)   Resp 16   Ht 5' (1.524 m)   Wt 61.2 kg (135 lb)   SpO2 97%   BMI  26.37 kg/m   Physical Exam  Constitutional: She is oriented to person, place, and time. She appears well-developed and well-nourished. No distress.  HENT:  Head: Normocephalic and atraumatic.  Eyes: Conjunctivae are normal.  Neck: Neck supple.  Cardiovascular: Normal rate, regular rhythm and normal heart sounds. Exam reveals no friction rub.  No murmur heard. Pulmonary/Chest: Effort normal and breath sounds normal. No respiratory distress. She has no wheezes. She has no rales.  Abdominal: She exhibits no distension. There is tenderness in the right  upper quadrant. There is positive Murphy's sign. There is no rigidity, no rebound and no guarding.  Musculoskeletal: She exhibits no edema.  Neurological: She is alert and oriented to person, place, and time. She exhibits normal muscle tone.  Skin: Skin is warm. Capillary refill takes less than 2 seconds.  Psychiatric: She has a normal mood and affect.  Nursing note and vitals reviewed.    ED Treatments / Results  Labs (all labs ordered are listed, but only abnormal results are displayed) Labs Reviewed  COMPREHENSIVE METABOLIC PANEL - Abnormal; Notable for the following components:      Result Value   Glucose, Bld 117 (*)    AST 81 (*)    ALT 60 (*)    All other components within normal limits  CBC - Abnormal; Notable for the following components:   WBC 13.3 (*)    All other components within normal limits  URINALYSIS, ROUTINE W REFLEX MICROSCOPIC - Abnormal; Notable for the following components:   Ketones, ur 5 (*)    All other components within normal limits  LIPASE, BLOOD  I-STAT BETA HCG BLOOD, ED ([facility], [facility], [facility] ONLY)    EKG  EKG Interpretation None       Radiology US Abdomen Limited Ruq  Result Date: 03/26/2017 CLINICAL DATA:  Acute onset right upper quadrant pain today. EXAM: ULTRASOUND ABDOMEN LIMITED RIGHT UPPER QUADRANT COMPARISON:  Right upper quadrant ultrasound 01/07/2017. FINDINGS: Gallbladder: A large stone measuring 3.4 cm is again seen in the gallbladder. There is some sludge within the gallbladder. No gallbladder wall thickening or pericholecystic fluid. Sonographer reports negative Murphy's sign. Common bile duct: Diameter: 0.5 cm Liver: No focal lesion. Fatty infiltration noted. Portal vein is patent on color Doppler imaging with normal direction of blood flow towards the liver. IMPRESSION: Large gallstone measuring 3.4 cm in gallbladder sludge without evidence of acute cholecystitis. Fatty infiltration of the liver. Electronically Signed   By: Drusilla Kanner  M.D.   On: 03/26/2017 22:21    Procedures Procedures (including critical care time)  Medications Ordered in ED Medications  ondansetron [facility]) injection 4 mg (0 mg Intravenous Hold 03/26/17 2201)  morphine 4 MG/ML injection 4 mg (0 mg Intravenous Hold 03/26/17 2201)  sodium chloride 0.9 % bolus 1,000 mL (0 mLs Intravenous Stopped 03/26/17 2302)  cefTRIAXone (ROCEPHIN) 2 g in sodium chloride 0.9 % 100 mL IVPB (2 g Intravenous New Bag/Given 03/26/17 2301)  0.9 %  sodium chloride infusion ( Intravenous New Bag/Given 03/26/17 2302)     Initial Impression / Assessment and Plan / ED Course  I have reviewed the triage vital signs and the nursing notes.  Pertinent labs & imaging results that were available during my care of the patient were reviewed by me and considered in my medical decision making (see chart for details).     37 yo F with PMHx gallstones here with RUQ pain. History and exam is c/w symptomatic biliary colic. U/S shows large GS but no  signs of cholecystitis, though lab work shows leukocytosis, increasing AST/ALT, and is concerning for possible early chole. Discussed with Dr. Johna Sheriff who will see pt in ED. Rocephin given.  Final Clinical Impressions(s) / ED Diagnoses   Final diagnoses:  RUQ pain  Biliary colic    ED Discharge Orders    None       Shaune Pollack, MD 03/26/17 2332

## 2017-03-26 NOTE — ED Notes (Signed)
Patient verbalizes if she chooses to leave she will let staff know so her IV can be removed.

## 2017-03-26 NOTE — H&P (Signed)
Gabriella Schultz is an 37 y.o. female.    Chief Complaint: Abdominal pain, nausea and vomiting  HPI: Patient is a 37 year old female who has been having episodic abdominal pain for about 2 months.  At the onset she presented to the emergency room and had an ultrasound showing cholelithiasis.  Cholecystectomy was recommended but she was lost to follow-up.  She states that she is continued to have intermittent bouts of right upper quadrant abdominal pain associated with nausea and vomiting after meals.  These were somewhat infrequent but gradually getting worse.  However over the past week she has essentially had significant pain and vomiting anytime she tries to eat.  She states that for the last couple of days she has not been able to keep anything down.  When she does not eat the pain will gradually go away.  No fever or chills.  No jaundice.  No urinary symptoms.  No change in normal bowel movements.  Past Medical History:  Diagnosis Date  . HPV (human papilloma virus) infection 2007    Past Surgical History:  Procedure Laterality Date  . CESAREAN SECTION      Family History  Problem Relation Age of Onset  . Hyperlipidemia Mother   . Hyperlipidemia Sister    Social History:  reports that  has never smoked. She does not have any smokeless tobacco history on file. She reports that she drinks alcohol. She reports that she does not use drugs.  Allergies: No Known Allergies  Current Facility-Administered Medications  Medication Dose Route Frequency Provider Last Rate Last Dose  . cefTRIAXone (ROCEPHIN) 2 g in sodium chloride 0.9 % 100 mL IVPB  2 g Intravenous Once Duffy Bruce, MD 200 mL/hr at 03/26/17 2301 2 g at 03/26/17 2301  . morphine 4 MG/ML injection 4 mg  4 mg Intravenous Once Duffy Bruce, MD   Stopped at 03/26/17 2201  . ondansetron (ZOFRAN) injection 4 mg  4 mg Intravenous Once Duffy Bruce, MD   Stopped at 03/26/17 2201   Current Outpatient Medications   Medication Sig Dispense Refill  . HYDROcodone-acetaminophen (NORCO) 5-325 MG tablet Take 1-2 tablets by mouth every 6 (six) hours as needed. 10 tablet 0  . predniSONE (DELTASONE) 20 MG tablet Take 1 tablet (20 mg total) by mouth daily with breakfast. (Patient not taking: Reported on 01/07/2017) 5 tablet 0  . ranitidine (ZANTAC) 300 MG tablet Take 1 tablet (300 mg total) by mouth at bedtime. (Patient not taking: Reported on 08/19/2014) 90 tablet 1  . traMADol (ULTRAM) 50 MG tablet Take 1 tablet (50 mg total) by mouth every 8 (eight) hours as needed. (Patient not taking: Reported on 01/07/2017) 30 tablet 0     Results for orders placed or performed during the hospital encounter of 03/26/17 (from the past 48 hour(s))  Lipase, blood     Status: None   Collection Time: 03/26/17  7:37 PM  Result Value Ref Range   Lipase 33 11 - 51 U/L    Comment: Performed at [facility], [place] [address] [place], [place] [address]  Comprehensive metabolic panel     Status: Abnormal   Collection Time: 03/26/17  7:37 PM  Result Value Ref Range   Sodium 140 135 - 145 mmol/L   Potassium 4.0 3.5 - 5.1 mmol/L   Chloride 106 101 - 111 mmol/L   CO2 26 22 - 32 mmol/L   Glucose, Bld 117 (H) 65 - 99 mg/dL   BUN 11 6 - 20 mg/dL  Creatinine, Ser 0.53 0.44 - 1.00 mg/dL   Calcium 9.3 8.9 - 10.3 mg/dL   Total Protein 7.8 6.5 - 8.1 g/dL   Albumin 4.4 3.5 - 5.0 g/dL   AST 81 (H) 15 - 41 U/L   ALT 60 (H) 14 - 54 U/L   Alkaline Phosphatase 59 38 - 126 U/L   Total Bilirubin 0.7 0.3 - 1.2 mg/dL   GFR calc non Af Amer >60 >60 mL/min   GFR calc Af Amer >60 >60 mL/min    Comment: (NOTE) The eGFR has been calculated using the CKD EPI equation. This calculation has not been validated in all clinical situations. eGFR's persistently <60 mL/min signify possible Chronic Kidney Disease.    Anion gap 8 5 - 15    Comment: Performed at [facility], [place] [address], [place], [place] [address]   CBC     Status: Abnormal   Collection Time: 03/26/17  7:37 PM  Result Value Ref Range   WBC 13.3 (H) 4.0 - 10.5 K/uL   RBC 4.53 3.87 - 5.11 MIL/uL   Hemoglobin 12.8 12.0 - 15.0 g/dL   HCT 39.5 36.0 - 46.0 %   MCV 87.2 78.0 - 100.0 fL   MCH 28.3 26.0 - 34.0 pg   MCHC 32.4 30.0 - 36.0 g/dL   RDW 15.3 11.5 - 15.5 %   Platelets 324 150 - 400 K/uL    Comment: Performed at [facility], [place] [address] [place], [place] [address]  I-Stat beta hCG blood, ED     Status: None   Collection Time: 03/26/17  7:52 PM  Result Value Ref Range   I-stat hCG, quantitative <5.0 <5 mIU/mL   Comment 3            Comment:   GEST. AGE      CONC.  (mIU/mL)   <=1 WEEK        5 - 50     2 WEEKS       50 - 500     3 WEEKS       100 - 10,000     4 WEEKS     1,000 - 30,000        FEMALE AND NON-PREGNANT FEMALE:     LESS THAN 5 mIU/mL   Urinalysis, Routine w reflex microscopic     Status: Abnormal   Collection Time: 03/26/17  9:38 PM  Result Value Ref Range   Color, Urine YELLOW YELLOW   APPearance CLEAR CLEAR   Specific Gravity, Urine 1.016 1.005 - 1.030   pH 7.0 5.0 - 8.0   Glucose, UA NEGATIVE NEGATIVE mg/dL   Hgb urine dipstick NEGATIVE NEGATIVE   Bilirubin Urine NEGATIVE NEGATIVE   Ketones, ur 5 (A) NEGATIVE mg/dL   Protein, ur NEGATIVE NEGATIVE mg/dL   Nitrite NEGATIVE NEGATIVE   Leukocytes, UA NEGATIVE NEGATIVE    Comment: Performed at [facility], [place] [address] [place], [place] [address]   US Abdomen Limited Ruq  Result Date: 03/26/2017 CLINICAL DATA:  Acute onset right upper quadrant pain today. EXAM: ULTRASOUND ABDOMEN LIMITED RIGHT UPPER QUADRANT COMPARISON:  Right upper quadrant ultrasound 01/07/2017. FINDINGS: Gallbladder: A large stone measuring 3.4 cm is again seen in the gallbladder. There is some sludge within the gallbladder. No gallbladder wall thickening or pericholecystic fluid. Sonographer reports negative Murphy's sign. Common bile duct:  Diameter: 0.5 cm Liver: No focal lesion. Fatty infiltration noted. Portal vein is patent on color Doppler  imaging with normal direction of blood flow towards the liver. IMPRESSION: Large gallstone measuring 3.4 cm in gallbladder sludge without evidence of acute cholecystitis. Fatty infiltration of the liver. Electronically Signed   By: Inge Rise M.D.   On: 03/26/2017 22:21    Review of Systems  Constitutional: Negative for chills, fever and weight loss.  Respiratory: Negative.   Cardiovascular: Negative.   Gastrointestinal: Positive for abdominal pain, nausea and vomiting. Negative for constipation, diarrhea and melena.  Genitourinary: Negative.   Musculoskeletal: Negative.     Blood pressure 128/89, pulse 77, temperature 98.4 F (36.9 C), temperature source Oral, resp. rate 16, height 5' (1.524 m), weight 61.2 kg (135 lb), SpO2 97 %. Physical Exam  General: Alert, mildly obese Hispanic female, in no distress Skin: Warm and dry without rash or infection. HEENT: No palpable masses or thyromegaly. Sclera nonicteric. Pupils equal round and reactive. Oropharynx clear. Lymph nodes: No cervical, supraclavicular, or inguinal nodes palpable. Lungs: Breath sounds clear and equal without increased work of breathing Cardiovascular: Regular rate and rhythm without murmur. No JVD or edema. Peripheral pulses intact. Abdomen: Nondistended. Soft and nontender. No masses palpable. No organomegaly. No palpable hernias. Extremities: No edema or joint swelling or deformity. No chronic venous stasis changes. Neurologic: Alert and fully oriented.  Affect normal.  Gross motor deficits.  Assessment/Plan Symptomatic cholelithiasis which is progressively worsening and she now presents with 1 week of pain in her right upper quadrant and vomiting after any attempt to eat and unable to keep food down due to pain and vomiting for 2 days.  She does have mild leukocytosis and mild elevated transaminases consistent  with early acute cholecystitis although she is nontender and pain is currently resolving.  I discussed the possibility of outpatient cholecystectomy but she feels unable to get by any longer due to inability to eat anything and because of this and because of the possibility of early acute cholecystitis I think the most reasonable course is to admit the patient for urgent laparoscopic cholecystectomy.I discussed the procedure in detail. We discussed the risks and benefits of a laparoscopic cholecystectomy and possible cholangiogram including, but not limited to bleeding, infection, injury to surrounding structures such as the intestine or liver, bile leak, retained gallstones, need to convert to an open procedure, prolonged diarrhea, blood clots such as  DVT, common bile duct injury, anesthesia risks, and possible need for additional procedures.  The likelihood of improvement in symptoms and return to the patient's normal status is good. We discussed the typical post-operative recovery course.  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  We discussed that the risk of bleeding is small but a transfusion in rare circumstances could be life-saving but she refuses.  This would not influence my decision and recommended cholecystectomy.  Edward Jolly, MD 03/26/2017, 11:24 PM

## 2017-03-26 NOTE — ED Triage Notes (Addendum)
Per EMS, patient from work, c/o sudden onset abdominal pain. Denies N/V/D. Hx gallstones.  Patient reports vomiting.   18g L AC

## 2017-03-27 ENCOUNTER — Encounter (HOSPITAL_COMMUNITY): Admission: EM | Disposition: A | Discharge status: Home / Self Care | Payer: Self-pay | Source: Home / Self Care

## 2017-03-27 ENCOUNTER — Inpatient Hospital Stay (HOSPITAL_COMMUNITY): Payer: Medicaid Other

## 2017-03-27 ENCOUNTER — Inpatient Hospital Stay (HOSPITAL_COMMUNITY): Payer: Medicaid Other | Admitting: Anesthesiology

## 2017-03-27 ENCOUNTER — Encounter (HOSPITAL_COMMUNITY): Payer: Self-pay | Admitting: *Deleted

## 2017-03-27 HISTORY — PX: CHOLECYSTECTOMY: SHX55

## 2017-03-27 LAB — HIV ANTIBODY (ROUTINE TESTING W REFLEX): HIV Screen 4th Generation wRfx: NONREACTIVE

## 2017-03-27 LAB — SURGICAL PCR SCREEN
MRSA, PCR: NEGATIVE
Staphylococcus aureus: NEGATIVE

## 2017-03-27 SURGERY — LAPAROSCOPIC CHOLECYSTECTOMY WITH INTRAOPERATIVE CHOLANGIOGRAM

## 2017-03-27 SURGERY — LAPAROSCOPIC CHOLECYSTECTOMY WITH INTRAOPERATIVE CHOLANGIOGRAM
Anesthesia: General | Site: Abdomen

## 2017-03-27 MED ORDER — FENTANYL CITRATE (PF) 100 MCG/2ML IJ SOLN: 25.0000 ug | INTRAMUSCULAR | Status: DC | PRN

## 2017-03-27 MED ORDER — 0.9 % SODIUM CHLORIDE (POUR BTL) OPTIME
TOPICAL | Status: DC | PRN
  Administered 2017-03-27: 1000 mL

## 2017-03-27 MED ORDER — MIDAZOLAM HCL 2 MG/2ML IJ SOLN
INTRAMUSCULAR | Status: AC
  Filled 2017-03-27: qty 2

## 2017-03-27 MED ORDER — POTASSIUM CHLORIDE IN NACL 20-0.9 MEQ/L-% IV SOLN
INTRAVENOUS | Status: DC
  Administered 2017-03-27: 18:00:00 via INTRAVENOUS
  Filled 2017-03-27: qty 1000

## 2017-03-27 MED ORDER — MIDAZOLAM HCL 5 MG/5ML IJ SOLN
INTRAMUSCULAR | Status: DC | PRN
  Administered 2017-03-27 (×2): 1 mg via INTRAVENOUS

## 2017-03-27 MED ORDER — BUPIVACAINE-EPINEPHRINE 0.5% -1:200000 IJ SOLN
INTRAMUSCULAR | Status: DC | PRN
  Administered 2017-03-27: 20 mL

## 2017-03-27 MED ORDER — FENTANYL CITRATE (PF) 100 MCG/2ML IJ SOLN
INTRAMUSCULAR | Status: DC | PRN
  Administered 2017-03-27 (×5): 50 ug via INTRAVENOUS

## 2017-03-27 MED ORDER — FENTANYL CITRATE (PF) 250 MCG/5ML IJ SOLN
INTRAMUSCULAR | Status: AC
  Filled 2017-03-27: qty 5

## 2017-03-27 MED ORDER — LIDOCAINE 2% (20 MG/ML) 5 ML SYRINGE
INTRAMUSCULAR | Status: AC
  Filled 2017-03-27: qty 5

## 2017-03-27 MED ORDER — FENTANYL CITRATE (PF) 100 MCG/2ML IJ SOLN
INTRAMUSCULAR | Status: AC
  Filled 2017-03-27: qty 2

## 2017-03-27 MED ORDER — OXYCODONE HCL 5 MG PO TABS
10.0000 mg | ORAL_TABLET | ORAL | Status: DC | PRN
  Administered 2017-03-28: 10 mg via ORAL
  Filled 2017-03-27: qty 2

## 2017-03-27 MED ORDER — PANTOPRAZOLE SODIUM 40 MG IV SOLR: 40.0000 mg | Freq: Every day | INTRAVENOUS | Status: DC

## 2017-03-27 MED ORDER — BUPIVACAINE-EPINEPHRINE (PF) 0.5% -1:200000 IJ SOLN
INTRAMUSCULAR | Status: AC
  Filled 2017-03-27: qty 30

## 2017-03-27 MED ORDER — ACETAMINOPHEN 325 MG PO TABS: 325.0000 mg | ORAL_TABLET | ORAL | Status: DC | PRN

## 2017-03-27 MED ORDER — LACTATED RINGERS IR SOLN
Status: DC | PRN
  Administered 2017-03-27: 1000 mL

## 2017-03-27 MED ORDER — POTASSIUM CHLORIDE IN NACL 20-0.9 MEQ/L-% IV SOLN
INTRAVENOUS | Status: DC
  Administered 2017-03-27 (×2): via INTRAVENOUS
  Filled 2017-03-27 (×3): qty 1000

## 2017-03-27 MED ORDER — ROCURONIUM BROMIDE 50 MG/5ML IV SOSY
PREFILLED_SYRINGE | INTRAVENOUS | Status: DC | PRN
  Administered 2017-03-27: 50 mg via INTRAVENOUS

## 2017-03-27 MED ORDER — LACTATED RINGERS IV SOLN
INTRAVENOUS | Status: DC | PRN
Start: 2017-03-27 — End: 2017-03-27
  Administered 2017-03-27 (×2): via INTRAVENOUS

## 2017-03-27 MED ORDER — HEPARIN SODIUM (PORCINE) 5000 UNIT/ML IJ SOLN
5000.0000 [IU] | Freq: Once | INTRAMUSCULAR | Status: AC
  Administered 2017-03-27: 5000 [IU] via SUBCUTANEOUS
  Filled 2017-03-27: qty 1

## 2017-03-27 MED ORDER — MORPHINE SULFATE (PF) 2 MG/ML IV SOLN
2.0000 mg | INTRAVENOUS | Status: DC | PRN
  Administered 2017-03-27 (×3): 2 mg via INTRAVENOUS
  Filled 2017-03-27 (×3): qty 1

## 2017-03-27 MED ORDER — SODIUM CHLORIDE 0.9 % IV SOLN
2.0000 g | INTRAVENOUS | Status: DC
  Filled 2017-03-27: qty 20

## 2017-03-27 MED ORDER — OXYCODONE HCL 5 MG PO TABS: 5.0000 mg | ORAL_TABLET | Freq: Once | ORAL | Status: DC | PRN

## 2017-03-27 MED ORDER — ONDANSETRON 4 MG PO TBDP: 4.0000 mg | ORAL_TABLET | Freq: Four times a day (QID) | ORAL | Status: DC | PRN

## 2017-03-27 MED ORDER — ENOXAPARIN SODIUM 40 MG/0.4ML ~~LOC~~ SOLN
40.0000 mg | SUBCUTANEOUS | Status: DC
  Administered 2017-03-28: 40 mg via SUBCUTANEOUS
  Filled 2017-03-27: qty 0.4

## 2017-03-27 MED ORDER — DEXAMETHASONE SODIUM PHOSPHATE 10 MG/ML IJ SOLN
INTRAMUSCULAR | Status: AC
  Filled 2017-03-27: qty 1

## 2017-03-27 MED ORDER — ACETAMINOPHEN 160 MG/5ML PO SOLN: 325.0000 mg | ORAL | Status: DC | PRN

## 2017-03-27 MED ORDER — ONDANSETRON HCL 4 MG/2ML IJ SOLN
4.0000 mg | Freq: Four times a day (QID) | INTRAMUSCULAR | Status: DC | PRN
  Administered 2017-03-27 – 2017-03-28 (×3): 4 mg via INTRAVENOUS
  Filled 2017-03-27 (×3): qty 2

## 2017-03-27 MED ORDER — IOPAMIDOL (ISOVUE-300) INJECTION 61%
INTRAVENOUS | Status: AC
  Filled 2017-03-27: qty 50

## 2017-03-27 MED ORDER — PROPOFOL 10 MG/ML IV BOLUS
INTRAVENOUS | Status: DC | PRN
  Administered 2017-03-27: 120 mg via INTRAVENOUS

## 2017-03-27 MED ORDER — IOPAMIDOL (ISOVUE-300) INJECTION 61%
INTRAVENOUS | Status: DC | PRN
  Administered 2017-03-27: 5 mL

## 2017-03-27 MED ORDER — ONDANSETRON HCL 4 MG/2ML IJ SOLN
INTRAMUSCULAR | Status: AC
  Filled 2017-03-27: qty 4

## 2017-03-27 MED ORDER — OXYCODONE HCL 5 MG/5ML PO SOLN
5.0000 mg | Freq: Once | ORAL | Status: DC | PRN
  Filled 2017-03-27: qty 5

## 2017-03-27 MED ORDER — DEXAMETHASONE SODIUM PHOSPHATE 10 MG/ML IJ SOLN
INTRAMUSCULAR | Status: DC | PRN
  Administered 2017-03-27: 10 mg via INTRAVENOUS

## 2017-03-27 MED ORDER — ROCURONIUM BROMIDE 10 MG/ML (PF) SYRINGE
PREFILLED_SYRINGE | INTRAVENOUS | Status: AC
  Filled 2017-03-27: qty 5

## 2017-03-27 MED ORDER — ONDANSETRON HCL 4 MG/2ML IJ SOLN
INTRAMUSCULAR | Status: DC | PRN
  Administered 2017-03-27: 4 mg via INTRAVENOUS

## 2017-03-27 MED ORDER — SUGAMMADEX SODIUM 200 MG/2ML IV SOLN
INTRAVENOUS | Status: DC | PRN
  Administered 2017-03-27: 120 mg via INTRAVENOUS

## 2017-03-27 MED ORDER — LIDOCAINE 2% (20 MG/ML) 5 ML SYRINGE
INTRAMUSCULAR | Status: DC | PRN
  Administered 2017-03-27: 60 mg via INTRAVENOUS

## 2017-03-27 SURGICAL SUPPLY — 38 items
ADH SKN CLS APL DERMABOND .7 (GAUZE/BANDAGES/DRESSINGS) ×1
APPLIER CLIP ROT 10 11.4 M/L (STAPLE) ×3
APR CLP MED LRG 11.4X10 (STAPLE) ×1
BAG SPEC RTRVL 10 TROC 200 (ENDOMECHANICALS) ×1
BAG SPEC RTRVL LRG 6X4 10 (ENDOMECHANICALS)
CABLE HIGH FREQUENCY MONO STRZ (ELECTRODE) ×3 IMPLANT
CATH REDDICK CHOLANGI 4FR 50CM (CATHETERS) IMPLANT
CHLORAPREP W/TINT 26ML (MISCELLANEOUS) ×3 IMPLANT
CLIP APPLIE ROT 10 11.4 M/L (STAPLE) ×1 IMPLANT
COVER MAYO STAND STRL (DRAPES) ×3 IMPLANT
COVER SURGICAL LIGHT HANDLE (MISCELLANEOUS) ×3 IMPLANT
DECANTER SPIKE VIAL GLASS SM (MISCELLANEOUS) ×3 IMPLANT
DERMABOND ADVANCED (GAUZE/BANDAGES/DRESSINGS) ×2
DERMABOND ADVANCED .7 DNX12 (GAUZE/BANDAGES/DRESSINGS) ×1 IMPLANT
DRAPE C-ARM 42X120 X-RAY (DRAPES) ×3 IMPLANT
ELECT REM PT RETURN 15FT ADLT (MISCELLANEOUS) ×3 IMPLANT
GLOVE BIOGEL PI IND STRL 7.5 (GLOVE) ×1 IMPLANT
GLOVE BIOGEL PI INDICATOR 7.5 (GLOVE) ×2
GLOVE ECLIPSE 7.5 STRL STRAW (GLOVE) ×3 IMPLANT
GOWN STRL REUS W/TWL XL LVL3 (GOWN DISPOSABLE) ×9 IMPLANT
HEMOSTAT SNOW SURGICEL 2X4 (HEMOSTASIS) IMPLANT
HEMOSTAT SURGICEL 4X8 (HEMOSTASIS) IMPLANT
KIT BASIN OR (CUSTOM PROCEDURE TRAY) ×3 IMPLANT
POUCH RETRIEVAL ECOSAC 10 (ENDOMECHANICALS) IMPLANT
POUCH RETRIEVAL ECOSAC 10MM (ENDOMECHANICALS) ×2
POUCH SPECIMEN RETRIEVAL 10MM (ENDOMECHANICALS) IMPLANT
SCISSORS LAP 5X35 DISP (ENDOMECHANICALS) ×3 IMPLANT
SET CHOLANGIOGRAPH MIX (MISCELLANEOUS) ×3 IMPLANT
SET IRRIG TUBING LAPAROSCOPIC (IRRIGATION / IRRIGATOR) ×3 IMPLANT
SLEEVE XCEL OPT CAN 5 100 (ENDOMECHANICALS) ×3 IMPLANT
SUT MNCRL AB 4-0 PS2 18 (SUTURE) ×3 IMPLANT
SUT VICRYL 0 UR6 27IN ABS (SUTURE) ×6 IMPLANT
TOWEL OR 17X26 10 PK STRL BLUE (TOWEL DISPOSABLE) ×3 IMPLANT
TRAY LAPAROSCOPIC (CUSTOM PROCEDURE TRAY) ×3 IMPLANT
TROCAR BLADELESS OPT 5 100 (ENDOMECHANICALS) ×3 IMPLANT
TROCAR XCEL BLUNT TIP 100MML (ENDOMECHANICALS) ×3 IMPLANT
TROCAR XCEL NON-BLD 11X100MML (ENDOMECHANICALS) ×3 IMPLANT
TUBING INSUF HEATED (TUBING) ×3 IMPLANT

## 2017-03-27 NOTE — Transfer of Care (Signed)
Immediate Anesthesia Transfer of Care Note  Patient: [name]  Procedure(s) Performed: LAPAROSCOPIC CHOLECYSTECTOMY WITH INTRAOPERATIVE CHOLANGIOGRAM (N/A Abdomen)  Patient Location: PACU  Anesthesia Type:General  Level of Consciousness: drowsy and patient cooperative  Airway & Oxygen Therapy: Patient Spontanous Breathing and Patient connected to face mask oxygen  Post-op Assessment: Report given to RN and Post -op Vital signs reviewed and stable  Post vital signs: Reviewed and stable  Last Vitals:  Vitals:   03/27/17 0550 03/27/17 1557  BP: 112/68 (P) 129/66  Pulse: 62 (P) 80  Resp: 14 (P) 15  Temp: 36.5 C (!) (P) 36.4 C  SpO2: 100% (P) 100%    Last Pain:  Vitals:   03/27/17 0749  TempSrc:   PainSc: 0-No pain         Complications: No apparent anesthesia complications

## 2017-03-27 NOTE — Anesthesia Preprocedure Evaluation (Signed)
Anesthesia Evaluation  Patient identified by MRN, date of birth, ID band Patient awake    Reviewed: Allergy & Precautions, NPO status , Patient's Chart, lab work & pertinent test results  History of Anesthesia Complications Negative for: history of anesthetic complications  Airway Mallampati: II  TM Distance: >3 FB Neck ROM: Full    Dental  (+) Teeth Intact   Pulmonary neg pulmonary ROS,    breath sounds clear to auscultation       Cardiovascular negative cardio ROS   Rhythm:Regular     Neuro/Psych negative neurological ROS  negative psych ROS   GI/Hepatic Neg liver ROS, cholecystitis   Endo/Other  negative endocrine ROS  Renal/GU negative Renal ROS     Musculoskeletal negative musculoskeletal ROS (+)   Abdominal   Peds  Hematology negative hematology ROS (+)   Anesthesia Other Findings   Reproductive/Obstetrics                             Anesthesia Physical Anesthesia Plan  ASA: I  Anesthesia Plan: General   Post-op Pain Management:    Induction: Intravenous  PONV Risk Score and Plan: 3 and Ondansetron, Dexamethasone and Midazolam  Airway Management Planned: Oral ETT  Additional Equipment: None  Intra-op Plan:   Post-operative Plan: Extubation in OR  Informed Consent: I have reviewed the patients History and Physical, chart, labs and discussed the procedure including the risks, benefits and alternatives for the proposed anesthesia with the patient or authorized representative who has indicated his/her understanding and acceptance.   Dental advisory given  Plan Discussed with: CRNA and Surgeon  Anesthesia Plan Comments:         Anesthesia Quick Evaluation

## 2017-03-27 NOTE — Anesthesia Postprocedure Evaluation (Signed)
Anesthesia Post Note  Patient: [name]  Procedure(s) Performed: LAPAROSCOPIC CHOLECYSTECTOMY WITH INTRAOPERATIVE CHOLANGIOGRAM (N/A Abdomen)     Patient location during evaluation: PACU Anesthesia Type: General Level of consciousness: awake and alert Pain management: pain level controlled Vital Signs Assessment: post-procedure vital signs reviewed and stable Respiratory status: spontaneous breathing, nonlabored ventilation, respiratory function stable and patient connected to nasal cannula oxygen Cardiovascular status: blood pressure returned to baseline and stable Postop Assessment: no apparent nausea or vomiting Anesthetic complications: no    Last Vitals:  Vitals:   03/27/17 1647 03/27/17 1750  BP: 132/90 125/80  Pulse: 69 67  Resp: 16 16  Temp:  36.9 C  SpO2: 100% 100%    Last Pain:  Vitals:   03/27/17 1750  TempSrc: Oral  PainSc:                  Fares Ramthun

## 2017-03-27 NOTE — Op Note (Signed)
Preoperative Diagnosis: Cholelithiasis and chronic cholecystitis  Postoprative Diagnosis: Same  Procedure: Procedure(s): LAPAROSCOPIC CHOLECYSTECTOMY WITH INTRAOPERATIVE CHOLANGIOGRAM   Surgeon: Glenna FellowsHoxworth, Shelley Pooley T   Assistants: None  Anesthesia:  General endotracheal anesthesia  Indications: Patient with known gallstones for several months presented to the ER with 1 week of worsening consistent postprandial right upper quadrant pain and vomiting to the point where she was unable to tolerate any oral intake and having pain several times a day.  Gallbladder ultrasound confirmed a large gallstone.  She had mild WBC possibly consistent with early cholecystitis.  We recommend proceeding with urgent laparoscopic cholecystectomy.  The procedure and indications, recovery and risks were discussed and she agreed to proceed.    Procedure Detail: Patient was brought to the operating room, placed in the supine position on the operating table, and general endotracheal anesthesia induced.  She was already on broad-spectrum IV antibiotics.  The abdomen was widely sterilely prepped and draped.  Patient timeout was performed and correct procedure verified.  PAS were in place.  Access was obtained with an open Hassan technique at the umbilicus with a 1-1/2 cm incision through mattress suture of 0 Vicryl and pneumoperitoneum established under direct vision an 11 mm trocar was placed subxiphoid and 2 5 mm trochars along the right subcostal margin.  The gallbladder appeared mildly chronically inflamed.  The fundus was grasped and elevated up with the liver and the infundibulum retracted inferolaterally.  Peritoneum anterior and posterior to Calot's triangle was incised and the distal gallbladder thoroughly dissected.  The distal gallbladder was dissected off the cholecystic plate and a good critical view obtained with the cystic artery and cystic duct skeletonized.  The cystic artery was doubly clipped proximally,  clipped distally and divided.  The cystic duct was clipped at the gallbladder junction and an operative cholangiogram obtained through the cystic duct.  This showed good filling of a normal common bile duct and intrahepatic ducts with free flow into the duodenum and no filling defects.  The cystic duct was then triply clipped proximally and divided.  The gallbladder was dissected free from its bed using hook cautery and placed in an eco-catch bag.  The bag was brought up to the umbilical incision.  There was a very large, approximately 3.5 cm gallstone.  I was unable to crush or retrieve this through the gallbladder due to its size.  The fascial incision was extended slightly on either side to allow removal of the large stone in gallbladder.  The fascial defect was then closed with 3 figure-of-eight sutures of 0 Vicryl.  We reinsufflated the abdomen and the closure was intact without any viscera trapped.  The right upper quadrant was thoroughly irrigated and complete hemostasis assured.  No evidence of injury or other problems.  All CO2 was evacuated and trochars removed.  Skin incisions were closed with subcuticular Monocryl and Dermabond.  Sponge needle and instrument counts were correct.    Findings: As above  Estimated Blood Loss:  Minimal         Drains: None  Blood Given: none          Specimens: Gallbladder and contents        Complications:  * No complications entered in OR log *         Disposition: PACU - hemodynamically stable.         Condition: stable

## 2017-03-27 NOTE — Anesthesia Procedure Notes (Signed)

## 2017-03-27 NOTE — ED Notes (Signed)
ED TO INPATIENT HANDOFF REPORT  Name/Age/Gender Gabriella Schultz 37 y.o. female  Code Status Code Status History    This patient does not have a recorded code status. Please follow your organizational policy for patients in this situation.      Home/SNF/Other Home  Chief Complaint abd pain  Level of Care/Admitting Diagnosis ED Disposition    ED Disposition Condition Comment   Admit  Hospital Area: [place] [[id number]]  Level of Care: Med-Surg [16]  Diagnosis: Cholelithiasis and acute cholecystitis with obstruction [767341]  Admitting Physician: Lewis Shock  Attending Physician: CCS, MD [[id number]]  Estimated length of stay: past midnight tomorrow  Certification:: I certify this patient will need inpatient services for at least 2 midnights  PT Class (Do Not Modify): Inpatient [101]  PT Acc Code (Do Not Modify): Private [1]       Medical History Past Medical History:  Diagnosis Date  . HPV (human papilloma virus) infection 2007    Allergies No Known Allergies  IV Location/Drains/Wounds Patient Lines/Drains/Airways Status   Active Line/Drains/Airways    Name:   Placement date:   Placement time:   Site:   Days:   Peripheral IV 03/26/17 Left Antecubital   03/26/17    2117    Antecubital   1          Labs/Imaging Results for orders placed or performed during the hospital encounter of 03/26/17 (from the past 48 hour(s))  Lipase, blood     Status: None   Collection Time: 03/26/17  7:37 PM  Result Value Ref Range   Lipase 33 11 - 51 U/L    Comment: Performed at [facility], [place] [address] [place], [place] [address]  Comprehensive metabolic panel     Status: Abnormal   Collection Time: 03/26/17  7:37 PM  Result Value Ref Range   Sodium 140 135 - 145 mmol/L   Potassium 4.0 3.5 - 5.1 mmol/L   Chloride 106 101 - 111 mmol/L   CO2 26 22 - 32 mmol/L   Glucose, Bld 117 (H) 65 - 99 mg/dL   BUN 11 6 - 20 mg/dL   Creatinine, Ser 0.53 0.44 - 1.00 mg/dL   Calcium 9.3 8.9 - 10.3 mg/dL   Total Protein 7.8 6.5 - 8.1 g/dL   Albumin 4.4 3.5 - 5.0 g/dL   AST 81 (H) 15 - 41 U/L   ALT 60 (H) 14 - 54 U/L   Alkaline Phosphatase 59 38 - 126 U/L   Total Bilirubin 0.7 0.3 - 1.2 mg/dL   GFR calc non Af Amer >60 >60 mL/min   GFR calc Af Amer >60 >60 mL/min    Comment: (NOTE) The eGFR has been calculated using the CKD EPI equation. This calculation has not been validated in all clinical situations. eGFR's persistently <60 mL/min signify possible Chronic Kidney Disease.    Anion gap 8 5 - 15    Comment: Performed at [facility], [place] [address], [place], [place] [address]  CBC     Status: Abnormal   Collection Time: 03/26/17  7:37 PM  Result Value Ref Range   WBC 13.3 (H) 4.0 - 10.5 K/uL   RBC 4.53 3.87 - 5.11 MIL/uL   Hemoglobin 12.8 12.0 - 15.0 g/dL   HCT 39.5 36.0 - 46.0 %   MCV 87.2 78.0 - 100.0 fL   MCH 28.3 26.0 - 34.0 pg   MCHC 32.4 30.0 - 36.0 g/dL   RDW 15.3  11.5 - 15.5 %   Platelets 324 150 - 400 K/uL    Comment: Performed at [facility], [place] [address] [place], [place] [address]  I-Stat beta hCG blood, ED     Status: None   Collection Time: 03/26/17  7:52 PM  Result Value Ref Range   I-stat hCG, quantitative <5.0 <5 mIU/mL   Comment 3            Comment:   GEST. AGE      CONC.  (mIU/mL)   <=1 WEEK        5 - 50     2 WEEKS       50 - 500     3 WEEKS       100 - 10,000     4 WEEKS     1,000 - 30,000        FEMALE AND NON-PREGNANT FEMALE:     LESS THAN 5 mIU/mL   Urinalysis, Routine w reflex microscopic     Status: Abnormal   Collection Time: 03/26/17  9:38 PM  Result Value Ref Range   Color, Urine YELLOW YELLOW   APPearance CLEAR CLEAR   Specific Gravity, Urine 1.016 1.005 - 1.030   pH 7.0 5.0 - 8.0   Glucose, UA NEGATIVE NEGATIVE mg/dL   Hgb urine dipstick NEGATIVE NEGATIVE   Bilirubin Urine NEGATIVE NEGATIVE   Ketones, ur 5 (A) NEGATIVE  mg/dL   Protein, ur NEGATIVE NEGATIVE mg/dL   Nitrite NEGATIVE NEGATIVE   Leukocytes, UA NEGATIVE NEGATIVE    Comment: Performed at [facility], [place] [address] [place], [place] [address]   US Abdomen Limited Ruq  Result Date: 03/26/2017 CLINICAL DATA:  Acute onset right upper quadrant pain today. EXAM: ULTRASOUND ABDOMEN LIMITED RIGHT UPPER QUADRANT COMPARISON:  Right upper quadrant ultrasound 01/07/2017. FINDINGS: Gallbladder: A large stone measuring 3.4 cm is again seen in the gallbladder. There is some sludge within the gallbladder. No gallbladder wall thickening or pericholecystic fluid. Sonographer reports negative Murphy's sign. Common bile duct: Diameter: 0.5 cm Liver: No focal lesion. Fatty infiltration noted. Portal vein is patent on color Doppler imaging with normal direction of blood flow towards the liver. IMPRESSION: Large gallstone measuring 3.4 cm in gallbladder sludge without evidence of acute cholecystitis. Fatty infiltration of the liver. Electronically Signed   By: Inge Rise M.D.   On: 03/26/2017 22:21    Pending Labs Unresulted Labs (From admission, onward)   Start     Ordered   Signed and Held  HIV antibody (Routine Testing)  Once,   R     Signed and Held      Vitals/Pain Today's Vitals   03/26/17 1803 03/26/17 2118 03/26/17 2145 03/26/17 2302  BP: 110/75  113/73 128/89  Pulse: 66  65 77  Resp: '18  17 16  ' Temp: 98.4 F (36.9 C)     TempSrc: Oral     SpO2: 100%  100% 97%  Weight:  135 lb (61.2 kg)    Height:  5' (1.524 m)      Isolation Precautions No active isolations  Medications Medications  ondansetron (ZOFRAN) injection 4 mg (0 mg Intravenous Hold 03/26/17 2201)  morphine 4 MG/ML injection 4 mg (0 mg Intravenous Hold 03/26/17 2201)  sodium chloride 0.9 % bolus 1,000 mL (0 mLs Intravenous Stopped 03/26/17 2302)  cefTRIAXone (ROCEPHIN) 2 g in sodium chloride 0.9 % 100 mL IVPB (0 g Intravenous Stopped 03/26/17 2331)  0.9 %  sodium  chloride infusion ( Intravenous New Bag/Given 03/26/17 2302)    Mobility walks

## 2017-03-27 NOTE — Progress Notes (Signed)
PreOp Nursing Note: patient arrived into preop area. Pt identified by armband and pt also stated name and DOB. MRN checked as well. Pt alert and oriented, voices no complaints. IV in Left AC WNL. IV[facility] of mait w/ K discontinued and [facility]. Pt asking questions about procedure, pt stated that MD was removing stones and not taking out gallbladder. Will inform MDA and OR team of this statement and will ask primary MD to speak with pt again prior to surgery. Comfort measures provided. Safety measures in place

## 2017-03-28 ENCOUNTER — Encounter (HOSPITAL_COMMUNITY): Payer: Self-pay | Admitting: General Surgery

## 2017-03-28 LAB — CBC
HCT: 37.5 % (ref 36.0–46.0)
HEMOGLOBIN: 12.3 g/dL (ref 12.0–15.0)
MCH: 28.1 pg (ref 26.0–34.0)
MCHC: 32.8 g/dL (ref 30.0–36.0)
MCV: 85.6 fL (ref 78.0–100.0)
Platelets: 297 10*3/uL (ref 150–400)
RBC: 4.38 MIL/uL (ref 3.87–5.11)
RDW: 15 % (ref 11.5–15.5)
WBC: 8.8 10*3/uL (ref 4.0–10.5)

## 2017-03-28 MED ORDER — TRAMADOL HCL 50 MG PO TABS: 50.0000 mg | ORAL_TABLET | Freq: Four times a day (QID) | ORAL | 0 refills | Status: AC | PRN

## 2017-03-28 MED ORDER — ACETAMINOPHEN 325 MG PO TABS
650.0000 mg | ORAL_TABLET | Freq: Four times a day (QID) | ORAL | Status: DC
  Administered 2017-03-28: 650 mg via ORAL
  Filled 2017-03-28: qty 2

## 2017-03-28 MED ORDER — ACETAMINOPHEN 325 MG PO TABS: 650.0000 mg | ORAL_TABLET | Freq: Four times a day (QID) | ORAL | Status: AC

## 2017-03-28 MED ORDER — OXYCODONE HCL 5 MG PO TABS: 10.0000 mg | ORAL_TABLET | ORAL | Status: DC | PRN

## 2017-03-28 MED ORDER — TRAMADOL HCL 50 MG PO TABS
50.0000 mg | ORAL_TABLET | Freq: Four times a day (QID) | ORAL | Status: DC | PRN
  Administered 2017-03-28: 50 mg via ORAL
  Filled 2017-03-28: qty 1

## 2017-03-28 NOTE — Discharge Instructions (Signed)
CIRUGIA LAPAROSCOPICA: INSTRUCCIONES DE POST OPERATORIO. ° °Revise siempre los documentos que le entreguen en el lugar donde se ha hecho la cirugia. ° °SI USTED NECESITA DOCUMENTOS DE INCAPACIDAD (DISABLE) O DE PERMISO FAMILAR (FAMILY LEAVE) NECESITA TRAERLOS A LA OFICINA PARA QUE SEAN PROCESADOS. °NO  SE LOS DE A SU DOCTOR. °1. A su alta del hospital se le dara una receta para controlar el dolor. Tomela como ha sido recetada, si la necesita. Si no la necesita puede tomar, Acetaminofen (Tylenol) o Ibuprofen (Advil) para aliviar dolor moderado. °2. Continue tomando el resto de sus medicinas. °3. Si necesita rellenar la receta, llame a la farmacia. ellos contactan a nuestra oficina pidiendo autorizacion. Este tipo de receta no pueden ser rellenadas despues de las  5pm o durante los fines de semana. °4. Con relacion a la dieta: debe ser ligera los primeros dias despues que llege a la casa. Ejemplo: sopas y galleticas. Tome bastante liquido esos dias. °5. La mayoria de los pacientes padecen de inflamacion y cambio de coloracion de la piel alrededor de las incisiones. esto toma dias en resolver.  pnerse una bolsa de hielo en el area affectada ayuda..  °6. Es comun tambien tener un poco de estrenimiento si esta tomado medicinas para el dolor. incremente la cantidad de liquidos a tomar y puede tomar (Colace) esto previene el problema. Si ya tiene estrenimiento, es decir no ha defecado en 48 horas, puede tomar un laxativo (Milk of Magnesia or Miralax) uselo como el paquete le explica. °7.  A menos que se le diga algo diferente. Remueva el bendaje a las 24-48 horas despues dela cirugia. y puede banarse en la ducha sin ningun problema. usted puede tener steri-strips (pequenas curitas transparentes en la piel puesta encima de la incision)  Estas banditas strips should be left on the skin for 7-10 days.   Si su cirujano puso pegamento encima de la incision usted puede banarse bajo la ducha en 24 horas. Este pegamento empezara a  caerse en las proximas 2-3 semanas. Si le pusieron suturas o presillas (grapos) estos seran quitados en su proxima cita en la oficina. . °a. ACTIVIDADES:  Puede hacer actividad ligera.  Como caminar , subir escaleras y poco a poco irlas incrementando tanto como las tolere. Puede tener relaciones sexuales cuando sea comfortable. No carge objetos pesados o haga esfuerzos que no sean aprovados por su doctor. °b. Puede manejar en cuanto no esta tomando medicamentos fuertes (narcoticos) para el dolor, pueda abrochar confortablemente el cinturon de seguridad, y pueda maniobrar y usar los pedales de su vehiculo con seguridad. °c. PUEDE REGRESAR A TRABAJAR  °8. Debe ver a su doctor para una cita de seguimiento en 2-3 semanas despues de la cirugia.  °9. OTRAS ISNSTRUCCIONES:___________________________________________________________________________________ °CUANDO LLAMAR A SU MEDICO: °1. FIEBRE mayor de  101.0 °2. No produccion de orina. °3. Sangramiento continue de la herida °4. Incremento de dolor, enrojecimientio o drenaje de la herida (incision) °5. Incremento de dolor abdominal. ° °The clinic staff is available to answer your questions during regular business hours.  Please don’t hesitate to call and ask to speak to one of the nurses for clinical concerns.  If you have a medical emergency, go to the nearest emergency room or call 911.  A surgeon from [facility] is always on call at the hospital. °[address], [address], [place], [place]  [address] ? [address] [address], [place], [place]   [address] °[phone] ? [phone] ? FAX [phone] °Web site: [email or web address] ° ° °

## 2017-03-28 NOTE — Discharge Summary (Signed)
[facility] Discharge Summary   Patient ID: Gabriella Schultz MRN: [id number] DOB/AGE: 35-May-1982 35 y.o.  Admit date: 03/26/2017 Discharge date: 03/28/2017  Admitting Diagnosis: Biliary colic  Discharge Diagnosis Patient Active Problem List   Diagnosis Date Noted  . Acute on chronic cholecystitis s/p lap cholecystectomy 03/27/2017 03/26/2017  . Carpal tunnel syndrome 08/19/2014  . Family history of hyperlipidemia 03/18/2014  . Enlarged tonsils 03/18/2014  . Pap smear for cervical cancer screening 03/18/2014  . Elevated hemoglobin A1c 03/18/2014  . GASTRITIS 06/22/2007    Consultants None  Imaging: Dg Cholangiogram Operative  Result Date: 03/27/2017 CLINICAL DATA:  Intraoperative cholangiogram during laparoscopic cholecystectomy. EXAM: INTRAOPERATIVE CHOLANGIOGRAM FLUOROSCOPY TIME:  10 seconds (4.9 mGy) COMPARISON:  Right upper quadrant abdominal ultrasound - 03/26/2017 FINDINGS: Intraoperative cholangiographic images of the right upper abdominal quadrant during laparoscopic cholecystectomy are provided for review. Surgical clips overlie the expected location of the gallbladder fossa. Contrast injection demonstrates selective cannulation of the central aspect of the cystic duct. There is passage of contrast through the central aspect of the cystic duct with filling of a non dilated common bile duct. There is passage of contrast though the CBD and into the descending portion of the duodenum. There is minimal reflux of injected contrast into the common hepatic duct and central aspect of the non dilated intrahepatic biliary system. There are no discrete filling defects within the opacified portions of the biliary system to suggest the presence of choledocholithiasis. IMPRESSION: No evidence of choledocholithiasis. Electronically Signed   By: Simonne Come M.D.   On: 03/27/2017 15:09   US Abdomen Limited Ruq  Result Date: 03/26/2017 CLINICAL DATA:  Acute onset right upper quadrant  pain today. EXAM: ULTRASOUND ABDOMEN LIMITED RIGHT UPPER QUADRANT COMPARISON:  Right upper quadrant ultrasound 01/07/2017. FINDINGS: Gallbladder: A large stone measuring 3.4 cm is again seen in the gallbladder. There is some sludge within the gallbladder. No gallbladder wall thickening or pericholecystic fluid. Sonographer reports negative Murphy's sign. Common bile duct: Diameter: 0.5 cm Liver: No focal lesion. Fatty infiltration noted. Portal vein is patent on color Doppler imaging with normal direction of blood flow towards the liver. IMPRESSION: Large gallstone measuring 3.4 cm in gallbladder sludge without evidence of acute cholecystitis. Fatty infiltration of the liver. Electronically Signed   By: Drusilla Kanner M.D.   On: 03/26/2017 22:21    Procedures Dr. Johna Sheriff (03/27/17) - Laparoscopic Cholecystectomy with [facility]   Hospital Course:  Patient is a 37 year old female who presented to [facility] with intermittent RUQ pain.  Workup showed cholelithiasis.  Patient was admitted and underwent procedure listed above.  Tolerated procedure well and was transferred to the floor.  Diet was advanced as tolerated.  On POD#1, the patient was voiding well, tolerating diet, ambulating well, pain well controlled, vital signs stable, incisions c/d/i and felt stable for discharge home.  Patient will follow up in our office in 2 weeks and knows to call with questions or concerns. She will call to confirm appointment date/time.    I have personally looked this patient up in the [place] Controlled Substance Database and reviewed their medications.    Allergies as of 03/28/2017   No Known Allergies     Medication List    STOP taking these medications   HYDROcodone-acetaminophen 5-325 MG tablet Commonly known as:  NORCO     TAKE these medications   acetaminophen 325 MG tablet Commonly known as:  TYLENOL Take 2 tablets (650 mg total) by mouth every 6 (six) hours.  ranitidine 300 MG tablet Commonly known as:   ZANTAC Take 1 tablet (300 mg total) by mouth at bedtime.   traMADol 50 MG tablet Commonly known as:  ULTRAM Take 1 tablet (50 mg total) by mouth every 6 (six) hours as needed for moderate pain.        Follow-up Information    [facility] Go on 04/12/2017.   Specialty:  General Surgery Why:  Your appointment is at 8:30 AM. Please arrive 30 min prior to appointment time. Bring photo ID and insurance information.  Contact information: [address] [place] [id number] [phone]           Signed: Wells GuilesKelly Rayburn, [facility] 03/28/2017, 2:52 PM Pager: [phone] Consults: [phone] Mon-Fri 7:00 am-4:30 pm Sat-Sun 7:00 am-11:30 am

## 2017-03-28 NOTE — Progress Notes (Signed)
Discharge and medication instructions reviewed with patient. Questions answered and patient denies further questions. Family member here to drive patient home. One prescription give to patient. Lina SarBeth Kezia Benevides, RN

## 2017-03-28 NOTE — Progress Notes (Signed)
[facility] Progress Note  1 Day Post-Op  Subjective: CC: nausea Patient complaining of some nausea and dizziness. Vomited 2x last night, but feeling a little better this AM. Passing flatus. Abdomen is sore but pain reasonably well controlled. UOP good. VSS.   Objective: Vital signs in last 24 hours: Temp:  [97.5 F (36.4 C)-98.8 F (37.1 C)] 98 F (36.7 C) (03/04 0510) Pulse Rate:  [54-80] 63 (03/04 0510) Resp:  [14-18] 18 (03/04 0510) BP: (112-132)/(66-90) 112/87 (03/04 0510) SpO2:  [98 %-100 %] 100 % (03/04 0510) Last BM Date: 03/26/17  Intake/Output from previous day: 03/03 0701 - 03/04 0700 In: 2833.8 [I.V.:2833.8] Out: 2375 [Urine:2350; Blood:25] Intake/Output this shift: No intake/output data recorded.  PE: Gen:  Alert, NAD, pleasant Card:  Regular rate and rhythm, pedal pulses 2+ BL Pulm:  Normal effort, clear to auscultation bilaterally Abd: Soft, appropriately tender, non-distended, bowel sounds present, no HSM, incisions C/D/I Skin: warm and dry, no rashes  Psych: A&Ox3   Lab Results:  Recent Labs    03/26/17 1937 03/28/17 0505  WBC 13.3* 8.8  HGB 12.8 12.3  HCT 39.5 37.5  PLT 324 297   BMET Recent Labs    03/26/17 1937  NA 140  K 4.0  CL 106  CO2 26  GLUCOSE 117*  BUN 11  CREATININE 0.53  CALCIUM 9.3   PT/INR No results for input(s): LABPROT, INR in the last 72 hours. CMP     Component Value Date/Time   NA 140 03/26/2017 1937   K 4.0 03/26/2017 1937   CL 106 03/26/2017 1937   CO2 26 03/26/2017 1937   GLUCOSE 117 (H) 03/26/2017 1937   BUN 11 03/26/2017 1937   CREATININE 0.53 03/26/2017 1937   CALCIUM 9.3 03/26/2017 1937   PROT 7.8 03/26/2017 1937   ALBUMIN 4.4 03/26/2017 1937   AST 81 (H) 03/26/2017 1937   ALT 60 (H) 03/26/2017 1937   ALKPHOS 59 03/26/2017 1937   BILITOT 0.7 03/26/2017 1937   GFRNONAA >60 03/26/2017 1937   GFRAA >60 03/26/2017 1937   Lipase     Component Value Date/Time   LIPASE 33 03/26/2017  1937       Studies/Results: Dg Cholangiogram Operative  Result Date: 03/27/2017 CLINICAL DATA:  Intraoperative cholangiogram during laparoscopic cholecystectomy. EXAM: INTRAOPERATIVE CHOLANGIOGRAM FLUOROSCOPY TIME:  10 seconds (4.9 mGy) COMPARISON:  Right upper quadrant abdominal ultrasound - 03/26/2017 FINDINGS: Intraoperative cholangiographic images of the right upper abdominal quadrant during laparoscopic cholecystectomy are provided for review. Surgical clips overlie the expected location of the gallbladder fossa. Contrast injection demonstrates selective cannulation of the central aspect of the cystic duct. There is passage of contrast through the central aspect of the cystic duct with filling of a non dilated common bile duct. There is passage of contrast though the CBD and into the descending portion of the duodenum. There is minimal reflux of injected contrast into the common hepatic duct and central aspect of the non dilated intrahepatic biliary system. There are no discrete filling defects within the opacified portions of the biliary system to suggest the presence of choledocholithiasis. IMPRESSION: No evidence of choledocholithiasis. Electronically Signed   By: Simonne Come M.D.   On: 03/27/2017 15:09   US Abdomen Limited Ruq  Result Date: 03/26/2017 CLINICAL DATA:  Acute onset right upper quadrant pain today. EXAM: ULTRASOUND ABDOMEN LIMITED RIGHT UPPER QUADRANT COMPARISON:  Right upper quadrant ultrasound 01/07/2017. FINDINGS: Gallbladder: A large stone measuring 3.4 cm is again seen in the gallbladder. There  is some sludge within the gallbladder. No gallbladder wall thickening or pericholecystic fluid. Sonographer reports negative Murphy's sign. Common bile duct: Diameter: 0.5 cm Liver: No focal lesion. Fatty infiltration noted. Portal vein is patent on color Doppler imaging with normal direction of blood flow towards the liver. IMPRESSION: Large gallstone measuring 3.4 cm in gallbladder  sludge without evidence of acute cholecystitis. Fatty infiltration of the liver. Electronically Signed   By: Drusilla Kannerhomas  Dalessio M.D.   On: 03/26/2017 22:21    Anti-infectives: Anti-infectives (From admission, onward)   Start     Dose/Rate Route Frequency Ordered Stop   03/27/17 2200  cefTRIAXone (ROCEPHIN) 2 g in sodium chloride 0.9 % 100 mL IVPB  Status:  Discontinued     2 g 200 mL/hr over 30 Minutes Intravenous Every 24 hours 03/27/17 0112 03/27/17 1706   03/26/17 2245  cefTRIAXone (ROCEPHIN) 2 g in sodium chloride 0.9 % 100 mL IVPB     2 g 200 mL/hr over 30 Minutes Intravenous  Once 03/26/17 2238 03/26/17 2331       Assessment/Plan Cholelithiasis with chronic cholecystitis S/P lap chole 03/27/17 Dr. Johna SheriffHoxworth - POD#1 - patient complaining of some dizziness and nausea - likely related to anesthesia vs pain medication - scheduled tylenol and added tramadol to try to minimize narcotics - continue to mobilize as tolerated - likely home this afternoon if feeling better  FEN: reg diet, IVF VTE: SCDs, lovenox ID: ceftriaxone periop   LOS: 2 days    Wells GuilesKelly Rayburn , [facility] 03/28/2017, 8:57 AM Pager: [phone] Consults: [phone] Mon-Fri 7:00 am-4:30 pm Sat-Sun 7:00 am-11:30 am

## 2019-03-13 IMAGING — US US ABDOMEN LIMITED
1 series · 14 of 25 positions shown · non-contrast
Comparison: 01/01/2007

CLINICAL DATA: Right upper quadrant pain for 1 day

EXAM:
ULTRASOUND ABDOMEN LIMITED RIGHT UPPER QUADRANT

[Series 1: us abdomen limited · 0.23mm/px · 57 acquisitions, 14 frames shown]
[im 1/57]
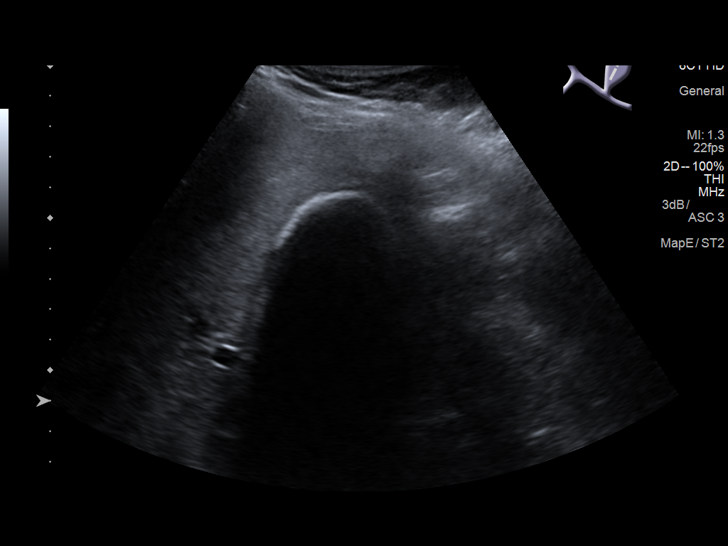
[im 5/57]
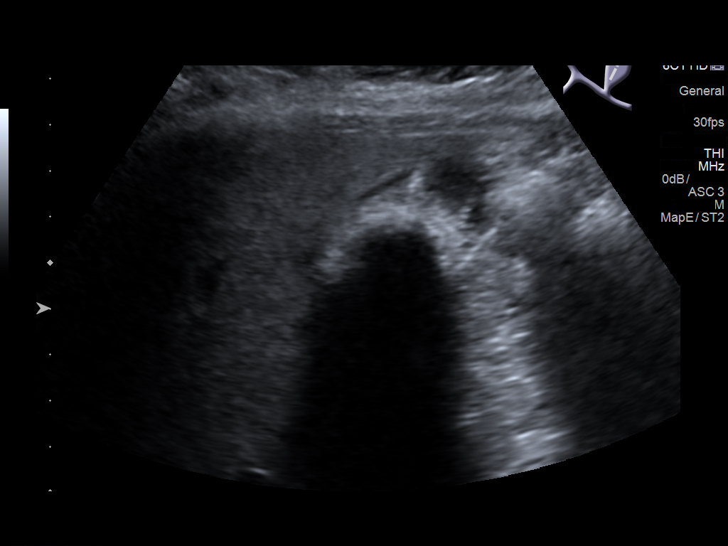
[im 10/57]
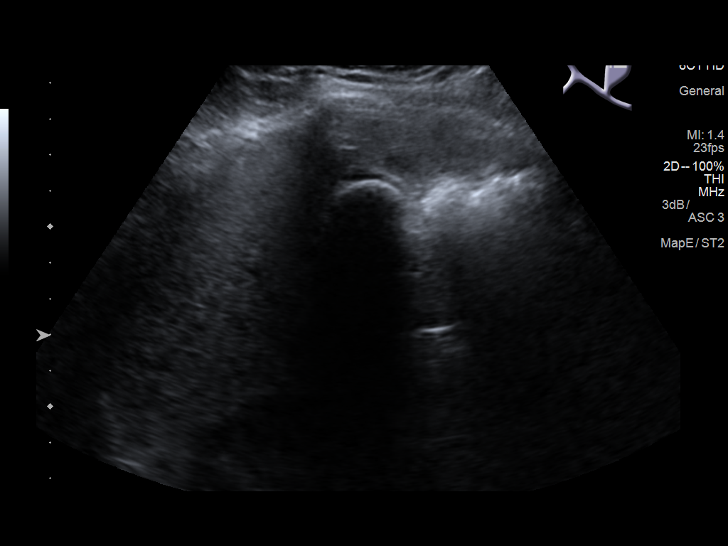
[im 15/57]
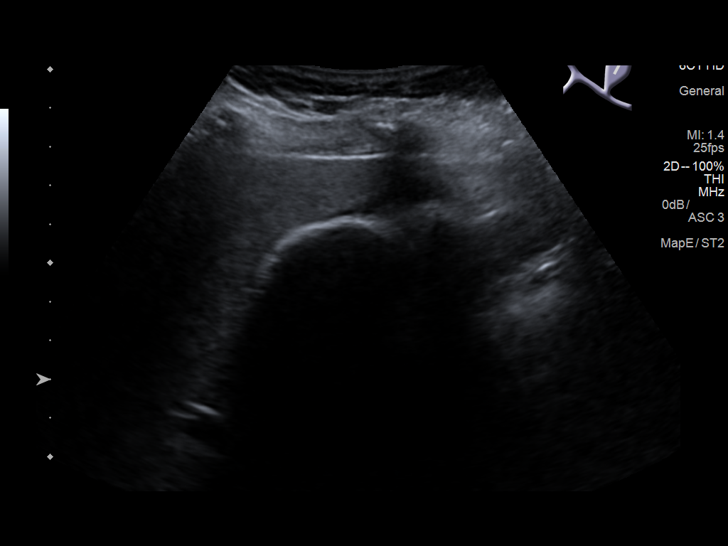
[im 19/57]
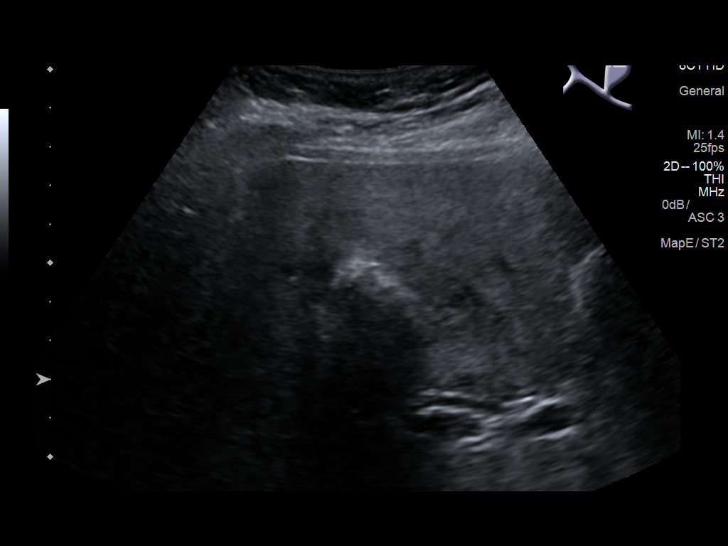
[im 22/57]
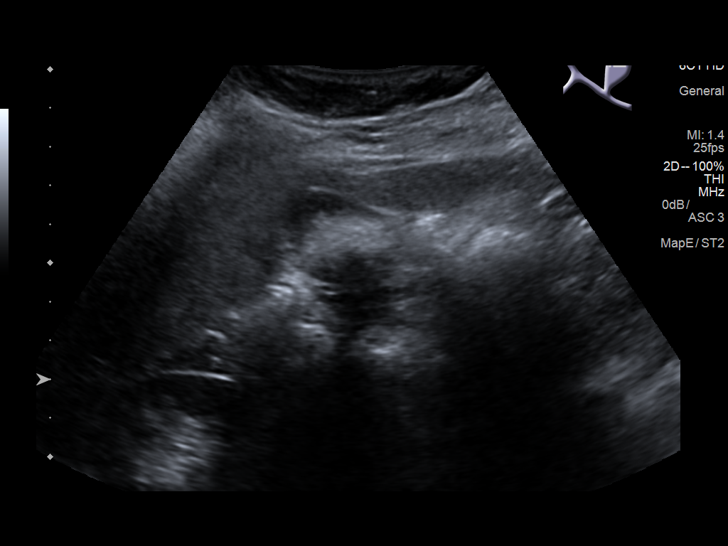
[im 26/57]
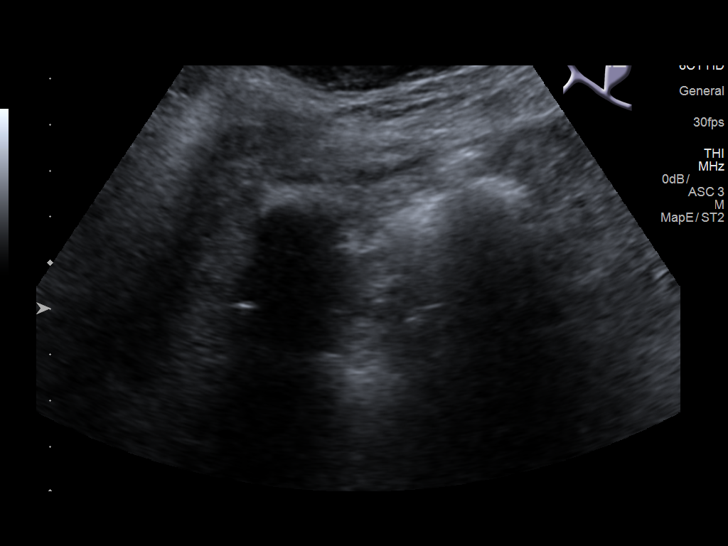
[im 31/57]
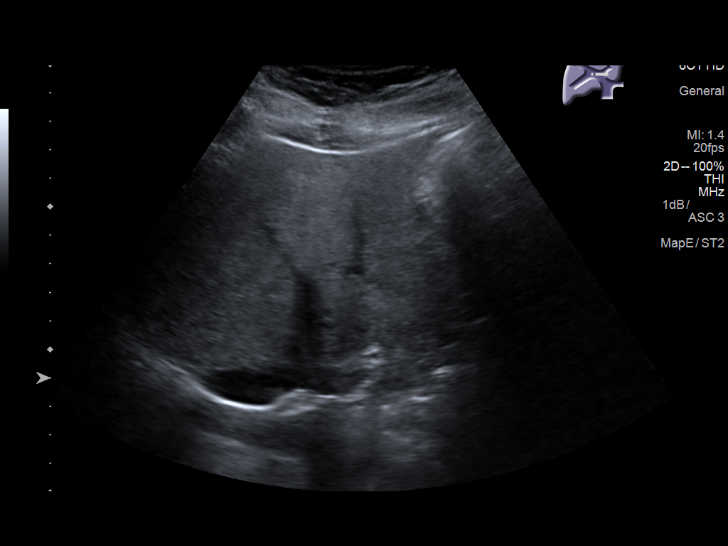
[im 36/57]
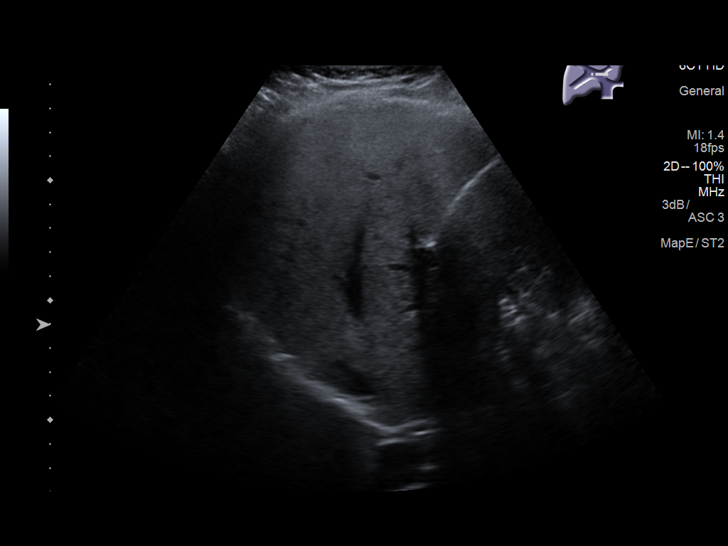
[im 38/57]
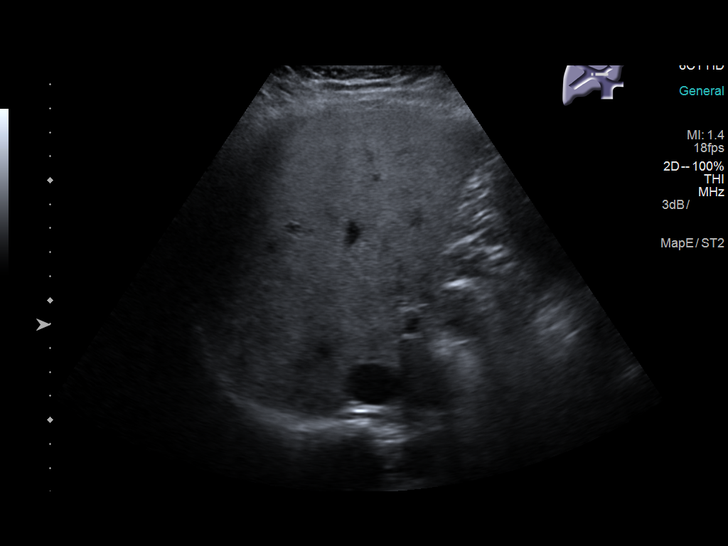
[im 43/57]
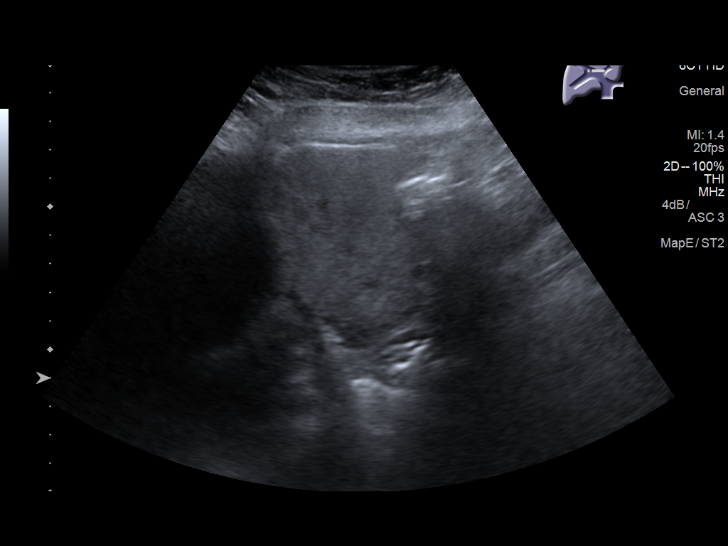
[im 47/57]
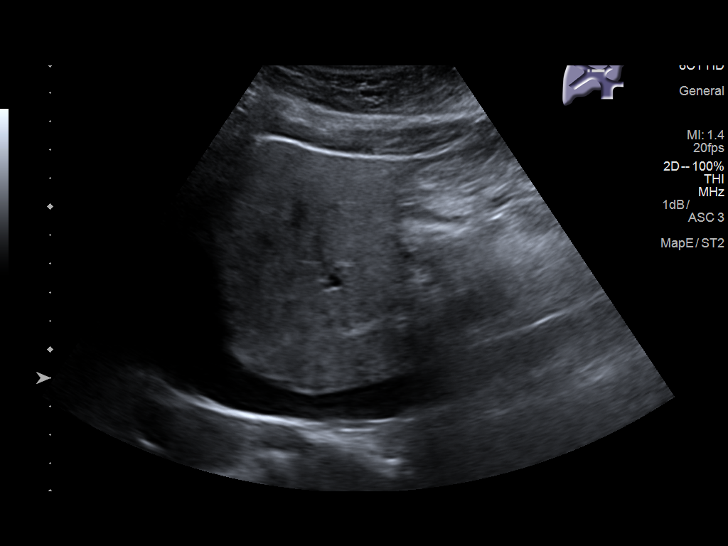
[im 52/57]
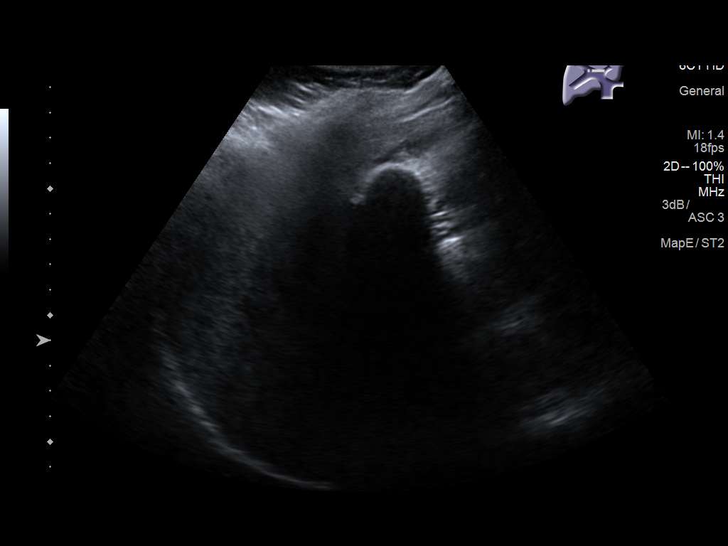
[im 57/57]
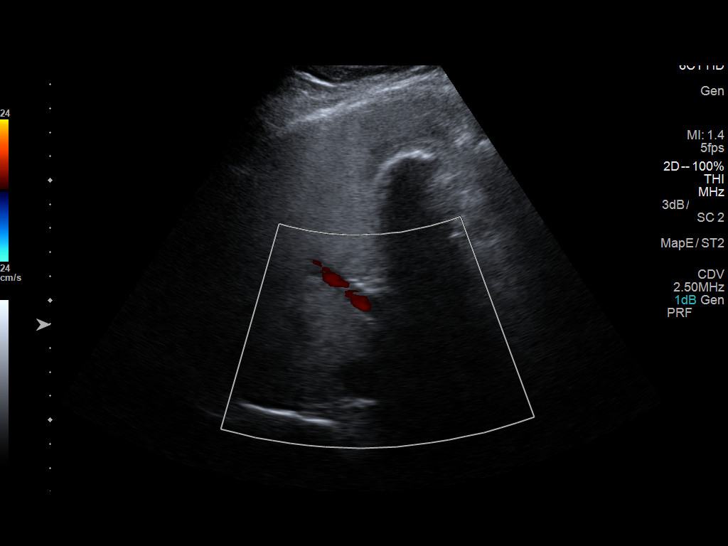

[14 of 25 positions shown; findings below may reference images not displayed]

FINDINGS: Gallbladder:

Partially distended with evidence of cholelithiasis and gallbladder
sludge. The largest gallstone measures 3.5 cm in greatest dimension.
No gallbladder wall thickening or pericholecystic fluid is noted.

Common bile duct:

Diameter: 4.8 mm.

Liver:

Mild increased echogenicity is noted consistent with fatty
infiltration of the liver. An area of decreased echogenicity is
noted adjacent to the gallbladder fossa likely related focal fatty
sparing. Portal vein is patent on color Doppler imaging with normal
direction of blood flow towards the liver.
IMPRESSION: Progressive cholelithiasis without gallbladder wall thickening or
pericholecystic fluid.

Fatty liver.

## 2019-04-25 ENCOUNTER — Emergency Department (HOSPITAL_COMMUNITY)
Admission: EM | Admit: 2019-04-25 | Discharge: 2019-04-25 | Disposition: A | Discharge status: Home / Self Care | Payer: Self-pay | Attending: Emergency Medicine | Admitting: Emergency Medicine

## 2019-04-25 ENCOUNTER — Other Ambulatory Visit: Payer: Self-pay

## 2019-04-25 ENCOUNTER — Encounter (HOSPITAL_COMMUNITY): Payer: Self-pay

## 2019-04-25 DIAGNOSIS — Z9049 Acquired absence of other specified parts of digestive tract: Secondary | ICD-10-CM | POA: Insufficient documentation

## 2019-04-25 DIAGNOSIS — Z20822 Contact with and (suspected) exposure to covid-19: Secondary | ICD-10-CM | POA: Insufficient documentation

## 2019-04-25 DIAGNOSIS — I951 Orthostatic hypotension: Secondary | ICD-10-CM | POA: Insufficient documentation

## 2019-04-25 LAB — I-STAT CHEM 8, ED
BUN: 9 mg/dL (ref 6–20)
Calcium, Ion: 1.14 mmol/L — ABNORMAL LOW (ref 1.15–1.40)
Chloride: 103 mmol/L (ref 98–111)
Creatinine, Ser: 0.4 mg/dL — ABNORMAL LOW (ref 0.44–1.00)
Glucose, Bld: 165 mg/dL — ABNORMAL HIGH (ref 70–99)
HCT: 39 % (ref 36.0–46.0)
Hemoglobin: 13.3 g/dL (ref 12.0–15.0)
Potassium: 3.6 mmol/L (ref 3.5–5.1)
Sodium: 138 mmol/L (ref 135–145)
TCO2: 27 mmol/L (ref 22–32)

## 2019-04-25 LAB — URINALYSIS, ROUTINE W REFLEX MICROSCOPIC
Bilirubin Urine: NEGATIVE
Glucose, UA: NEGATIVE mg/dL
Hgb urine dipstick: NEGATIVE
Ketones, ur: NEGATIVE mg/dL
Leukocytes,Ua: NEGATIVE
Nitrite: NEGATIVE
Protein, ur: NEGATIVE mg/dL
Specific Gravity, Urine: 1.004 — ABNORMAL LOW (ref 1.005–1.030)
pH: 7 (ref 5.0–8.0)

## 2019-04-25 LAB — CBC WITH DIFFERENTIAL/PLATELET
Abs Immature Granulocytes: 0.02 10*3/uL (ref 0.00–0.07)
Basophils Absolute: 0.1 10*3/uL (ref 0.0–0.1)
Basophils Relative: 1 %
Eosinophils Absolute: 0 10*3/uL (ref 0.0–0.5)
Eosinophils Relative: 0 %
HCT: 38.3 % (ref 36.0–46.0)
Hemoglobin: 12.1 g/dL (ref 12.0–15.0)
Immature Granulocytes: 0 %
Lymphocytes Relative: 15 %
Lymphs Abs: 1.5 10*3/uL (ref 0.7–4.0)
MCH: 27.9 pg (ref 26.0–34.0)
MCHC: 31.6 g/dL (ref 30.0–36.0)
MCV: 88.2 fL (ref 80.0–100.0)
Monocytes Absolute: 0.4 10*3/uL (ref 0.1–1.0)
Monocytes Relative: 4 %
Neutro Abs: 8.2 10*3/uL — ABNORMAL HIGH (ref 1.7–7.7)
Neutrophils Relative %: 80 %
Platelets: 323 10*3/uL (ref 150–400)
RBC: 4.34 MIL/uL (ref 3.87–5.11)
RDW: 14.7 % (ref 11.5–15.5)
WBC: 10.2 10*3/uL (ref 4.0–10.5)
nRBC: 0 % (ref 0.0–0.2)

## 2019-04-25 LAB — POC URINE PREG, ED: Preg Test, Ur: NEGATIVE

## 2019-04-25 LAB — CBG MONITORING, ED: Glucose-Capillary: 157 mg/dL — ABNORMAL HIGH (ref 70–99)

## 2019-04-25 LAB — SARS CORONAVIRUS 2 (TAT 6-24 HRS): SARS Coronavirus 2: NEGATIVE

## 2019-04-25 MED ORDER — SODIUM CHLORIDE 0.9 % IV BOLUS
1000.0000 mL | Freq: Once | INTRAVENOUS | Status: AC
  Administered 2019-04-25: 15:00:00 1000 mL via INTRAVENOUS

## 2019-04-25 MED ORDER — MECLIZINE HCL 25 MG PO TABS: 25.0000 mg | ORAL_TABLET | Freq: Three times a day (TID) | ORAL | 0 refills | Status: AC | PRN

## 2019-04-25 MED ORDER — ONDANSETRON HCL 4 MG/2ML IJ SOLN
4.0000 mg | Freq: Once | INTRAMUSCULAR | Status: AC
  Administered 2019-04-25: 15:00:00 4 mg via INTRAVENOUS
  Filled 2019-04-25: qty 2

## 2019-04-25 MED ORDER — MECLIZINE HCL 25 MG PO TABS
25.0000 mg | ORAL_TABLET | Freq: Once | ORAL | Status: AC
  Administered 2019-04-25: 15:00:00 25 mg via ORAL
  Filled 2019-04-25: qty 1

## 2019-04-25 NOTE — ED Triage Notes (Signed)
Patient from urgent care  Intermittent dizziness and fatigue since yesterday  Dizziness increases when standing  Denies pain, SOB, chest pain.   AO x4  132/90 76 HR 16 RR 100% RA 129 CBG

## 2019-04-25 NOTE — ED Provider Notes (Signed)
[facility]-EMERGENCY DEPT Provider Note   CSN: [id number] Arrival date & time: 04/25/19  1256     History No chief complaint on file.   Gabriella Schultz is a 39 y.o. female.  The history is provided by the patient. No language interpreter was used.     39 year old female sent here from urgent care center for evaluation of dizziness.  Patient report yesterday when she got off work, she felt dizzy.  She described more of a lightheadedness sensation with symptoms of feeling "drunk" with positional change.  She went home, ate a good meal, rested, felt better, and today when she went to work, she once again experienced the same symptoms.  States that she feels like she was going to fall.  She endorsed feeling nauseous without vomiting.  She does not complain of any headache, diplopia, vision changes, confusion, chest pain, shortness of breath, productive cough, runny nose sneezing sore throat, abdominal pain, dysuria, focal numbness or focal weakness.  She denies any recent medication changes, denies tobacco or alcohol use, denies any recent COVID-19 exposure, or having any recent infection.  She does not complain of any room spinning sensation.  At the present is while resting, symptom is mostly resolved.  Last menstrual period was 2 weeks ago.  Past Medical History:  Diagnosis Date  . HPV (human papilloma virus) infection 2007    Patient Active Problem List   Diagnosis Date Noted  . Acute on chronic cholecystitis s/p lap cholecystectomy 03/27/2017 03/26/2017  . Carpal tunnel syndrome 08/19/2014  . Family history of hyperlipidemia 03/18/2014  . Enlarged tonsils 03/18/2014  . Pap smear for cervical cancer screening 03/18/2014  . Elevated hemoglobin A1c 03/18/2014  . GASTRITIS 06/22/2007    Past Surgical History:  Procedure Laterality Date  . CESAREAN SECTION    . CHOLECYSTECTOMY N/A 03/27/2017   Procedure: LAPAROSCOPIC CHOLECYSTECTOMY WITH INTRAOPERATIVE  CHOLANGIOGRAM;  Surgeon: Glenna Fellows, MD;  Location: [facility];  Service: General;  Laterality: N/A;     OB History   No obstetric history on file.     Family History  Problem Relation Age of Onset  . Hyperlipidemia Mother   . Hyperlipidemia Sister     Social History   Tobacco Use  . Smoking status: Never Smoker  . Smokeless tobacco: Never Used  Substance Use Topics  . Alcohol use: Yes    Comment: ocassional  . Drug use: No    Home Medications Prior to Admission medications   Medication Sig Start Date End Date Taking? Authorizing Provider  acetaminophen (TYLENOL) 325 MG tablet Take 2 tablets (650 mg total) by mouth every 6 (six) hours. 03/28/17   Rayburn, Alphonsus Sias, PA-C  ranitidine (ZANTAC) 300 MG tablet Take 1 tablet (300 mg total) by mouth at bedtime. Patient not taking: Reported on 08/19/2014 03/18/14   Dessa Phi, MD  traMADol (ULTRAM) 50 MG tablet Take 1 tablet (50 mg total) by mouth every 6 (six) hours as needed for moderate pain. 03/28/17   Rayburn, Alphonsus Sias, PA-C    Allergies    Patient has no known allergies.  Review of Systems   Review of Systems  All other systems reviewed and are negative.   Physical Exam Updated Vital Signs BP 116/84   Pulse 76   Temp 98.2 F (36.8 C) (Oral)   Resp 19   Ht 5\' 1"  (1.549 m)   Wt 64.9 kg   SpO2 100%   BMI 27.02 kg/m   Physical Exam Vitals and nursing  note reviewed.  Constitutional:      General: She is not in acute distress.    Appearance: She is well-developed.  HENT:     Head: Atraumatic.     Right Ear: Tympanic membrane normal.     Left Ear: Tympanic membrane normal.     Nose: Nose normal.  Eyes:     Extraocular Movements: Extraocular movements intact.     Conjunctiva/sclera: Conjunctivae normal.     Pupils: Pupils are equal, round, and reactive to light.  Cardiovascular:     Rate and Rhythm: Normal rate and regular rhythm.     Pulses: Normal pulses.     Heart sounds: Normal heart sounds.    Pulmonary:     Effort: Pulmonary effort is normal.     Breath sounds: Normal breath sounds.  Abdominal:     Palpations: Abdomen is soft.     Tenderness: There is no abdominal tenderness.  Musculoskeletal:     Cervical back: Neck supple.     Comments: 5 out of 5 strength to all 4 extremities  Skin:    Findings: No rash.  Neurological:     Mental Status: She is alert and oriented to person, place, and time.     Comments: Neurologic exam:  Speech clear, pupils equal round reactive to light, extraocular movements intact  Normal peripheral visual fields Cranial nerves III through XII normal including no facial droop Follows commands, moves all extremities x4, normal strength to bilateral upper and lower extremities at all major muscle groups including grip Sensation normal to light touch  Coordination intact, no limb ataxia, finger-nose-finger normal Rapid alternating movements normal No pronator drift Gait not tested   Psychiatric:        Mood and Affect: Mood normal.     ED Results / Procedures / Treatments   Labs (all labs ordered are listed, but only abnormal results are displayed) Labs Reviewed  CBC WITH DIFFERENTIAL/PLATELET - Abnormal; Notable for the following components:      Result Value   Neutro Abs 8.2 (*)    All other components within normal limits  URINALYSIS, ROUTINE W REFLEX MICROSCOPIC - Abnormal; Notable for the following components:   Color, Urine STRAW (*)    Specific Gravity, Urine 1.004 (*)    All other components within normal limits  I-STAT CHEM 8, ED - Abnormal; Notable for the following components:   Creatinine, Ser 0.40 (*)    Glucose, Bld 165 (*)    Calcium, Ion 1.14 (*)    All other components within normal limits  CBG MONITORING, ED - Abnormal; Notable for the following components:   Glucose-Capillary 157 (*)    All other components within normal limits  SARS CORONAVIRUS 2 (TAT 6-24 HRS)  POC URINE PREG, ED    EKG None   Date:  04/25/2019  Rate: 75  Rhythm: normal sinus rhythm  QRS Axis: normal  Intervals: normal  ST/T Wave abnormalities: normal  Conduction Disutrbances: none  Narrative Interpretation:   Old EKG Reviewed: No significant changes noted     Radiology No results found.  Procedures Procedures (including critical care time)  Medications Ordered in ED Medications  sodium chloride 0.9 % bolus 1,000 mL (0 mLs Intravenous Stopped 04/25/19 1518)  ondansetron (ZOFRAN) injection 4 mg (4 mg Intravenous Given 04/25/19 1431)  meclizine (ANTIVERT) tablet 25 mg (25 mg Oral Given 04/25/19 1431)    ED Course  I have reviewed the triage vital signs and the nursing notes.  Pertinent labs &  imaging results that were available during my care of the patient were reviewed by me and considered in my medical decision making (see chart for details).    MDM Rules/Calculators/A&P                      BP 116/84   Pulse 76   Temp 98.2 F (36.8 C) (Oral)   Resp 19   Ht 5\' 1"  (1.549 m)   Wt 64.9 kg   SpO2 100%   BMI 27.02 kg/m   Final Clinical Impression(s) / ED Diagnoses Final diagnoses:  Orthostatic hypotension    Rx / DC Orders ED Discharge Orders         Ordered    meclizine (ANTIVERT) 25 MG tablet  3 times daily PRN     04/25/19 1603         1:34 PM Patient here complaining of dizziness that sounds more like lightheadedness and less likely to be vertigo.  She is currently resting comfortably.  Will obtain orthostatic vital sign, and labs.  IV fluid given.  Low suspicion for central vertigo such as posterior circulation stroke.  3:18 PM Labs are reassuring, no electrolyte abnormalities, no anemia, pregnancy test is negative, CBG is 165, EKG without concerning arrhythmia. Orthostatic vital sign significant for increased heart rates but blood pressure without evidence of hypertension. Patient received IV fluids as well as meclizine and at this time she feels much better. Able to ambulate. I will  order a COVID-19 test and patient may follow-up with the results within the next 24 hours through MyChart.  Gabriella Schultz was evaluated in Emergency Department on 04/25/2019 for the symptoms described in the history of present illness. She was evaluated in the context of the global COVID-19 pandemic, which necessitated consideration that the patient might be at risk for infection with the SARS-CoV-2 virus that causes COVID-19. Institutional protocols and algorithms that pertain to the evaluation of patients at risk for COVID-19 are in a state of rapid change based on information released by regulatory bodies including the CDC and federal and state organizations. These policies and algorithms were followed during the patient's care in the ED.    04/27/2019, PA-C 04/25/19 1606    04/27/19, MD 04/26/19 1125

## 2019-04-25 NOTE — Discharge Instructions (Addendum)
Your symptoms may be due to dehydration.  Drink plenty of fluid.  Use meclizine as needed for dizziness.  A covid-19 test was obtained.  You may check the result in the next 24 hrs through MyChart, link below.  Return if you have any concerns.

## 2019-05-24 ENCOUNTER — Other Ambulatory Visit: Payer: Self-pay

## 2019-05-24 ENCOUNTER — Ambulatory Visit: Payer: Self-pay

## 2019-05-30 IMAGING — US US ABDOMEN LIMITED
1 series · 14 of 25 positions shown · non-contrast
Comparison: Right upper quadrant ultrasound 01/07/2017.

CLINICAL DATA: Acute onset right upper quadrant pain today.

EXAM:
ULTRASOUND ABDOMEN LIMITED RIGHT UPPER QUADRANT

[Series 1: us abdomen limited · 0.14mm/px · 14 of 59 slices shown]
[im 1/59]
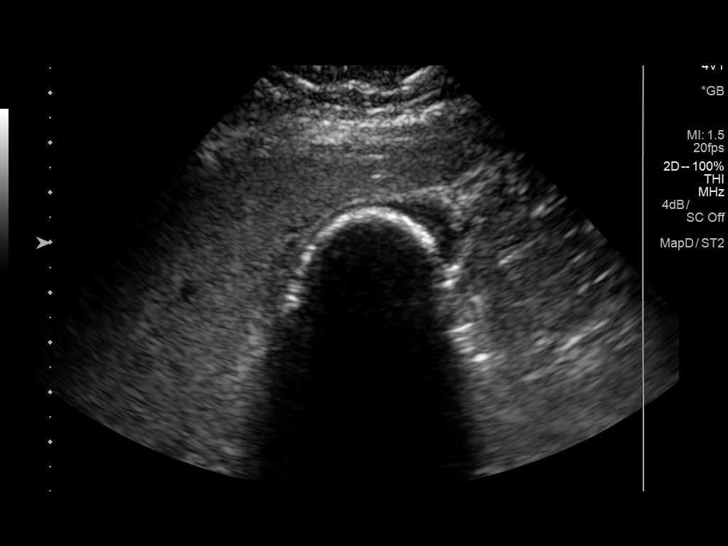
[im 5/59]
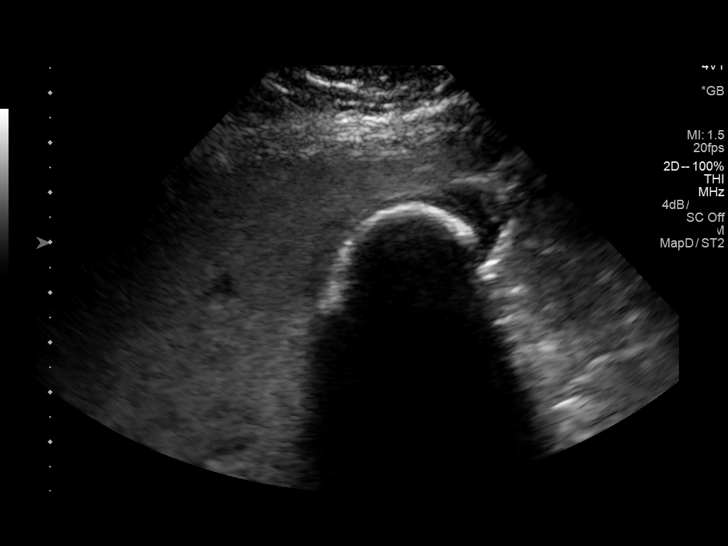
[im 10/59]
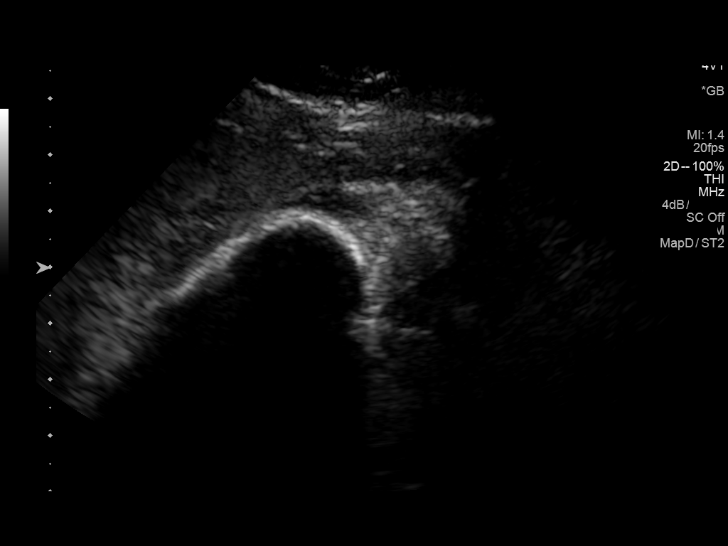
[im 15/59]
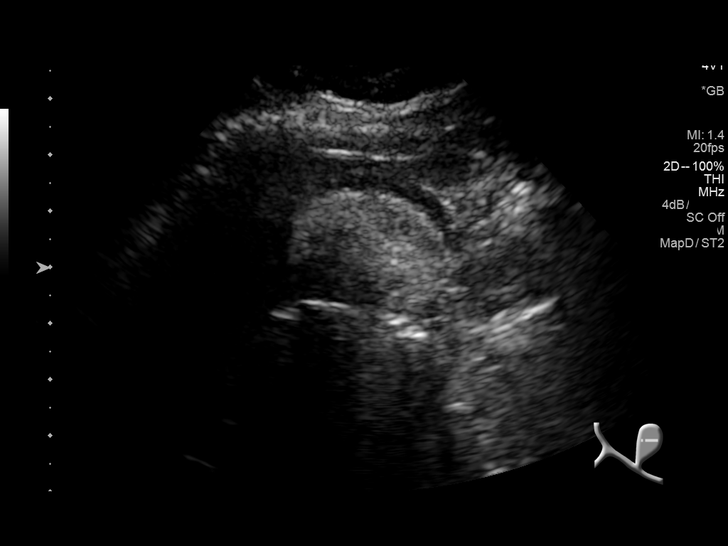
[im 20/59]
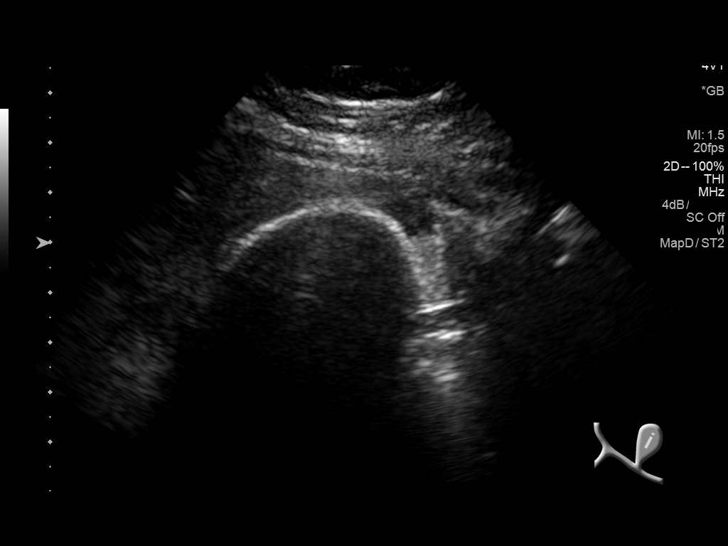
[im 22/59]
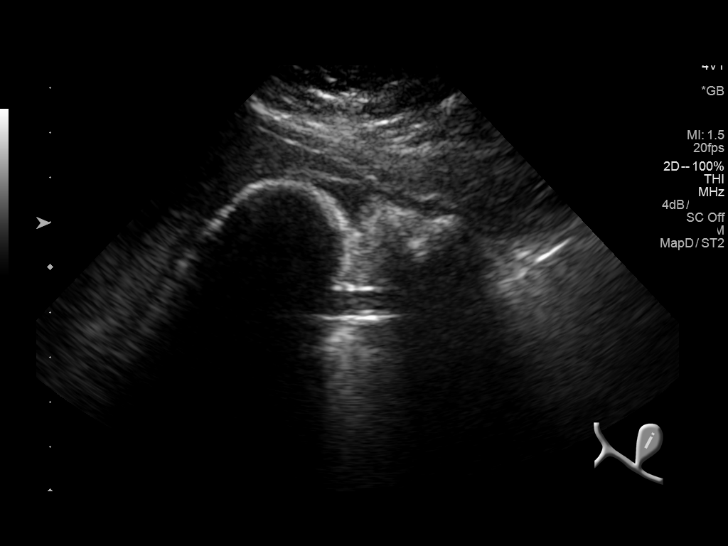
[im 27/59]
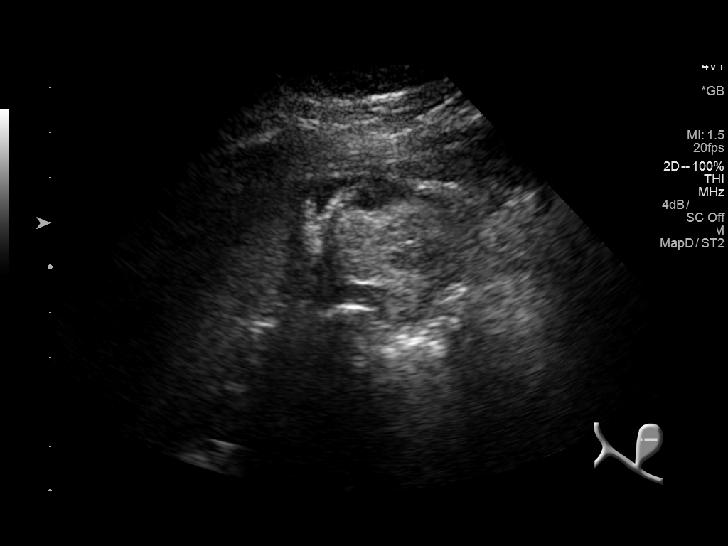
[im 32/59]
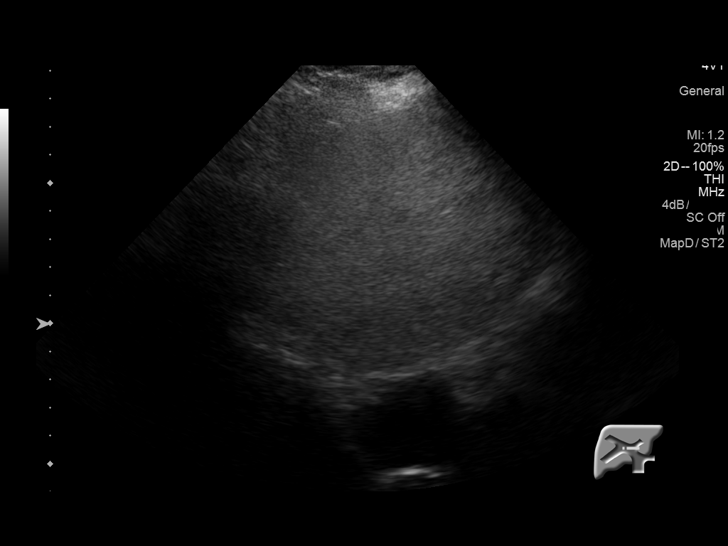
[im 37/59]
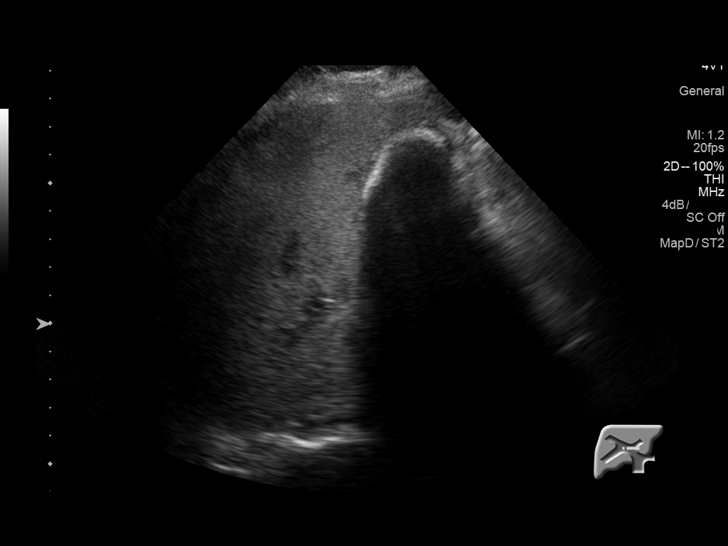
[im 39/59]
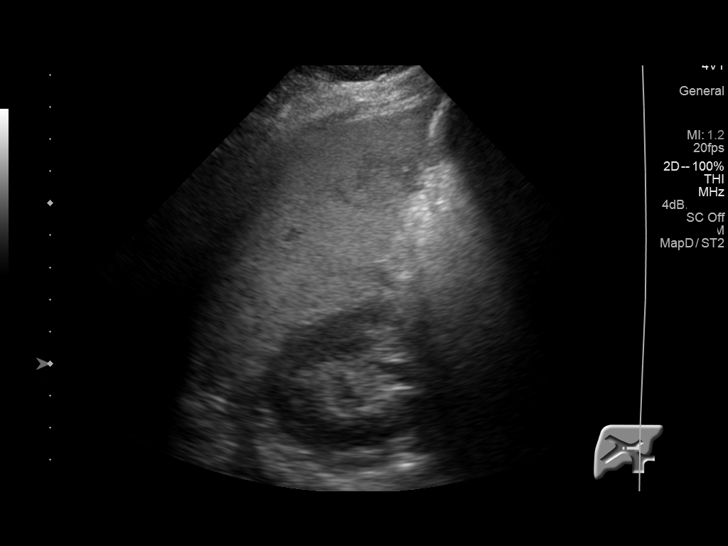
[im 44/59]
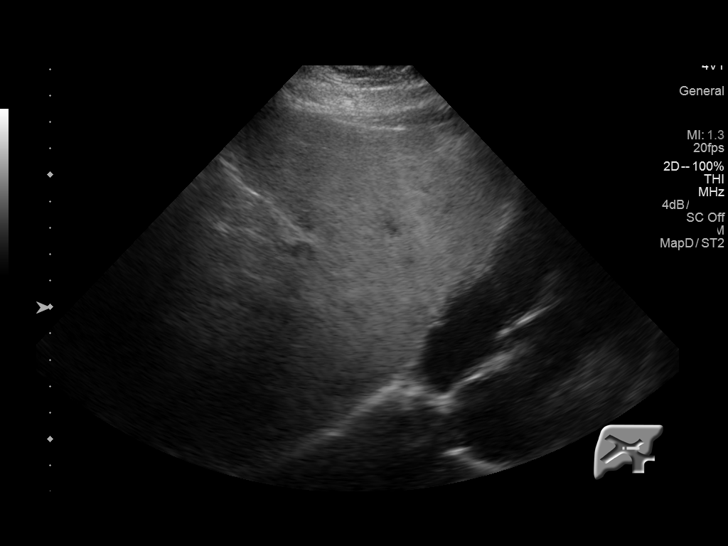
[im 49/59]
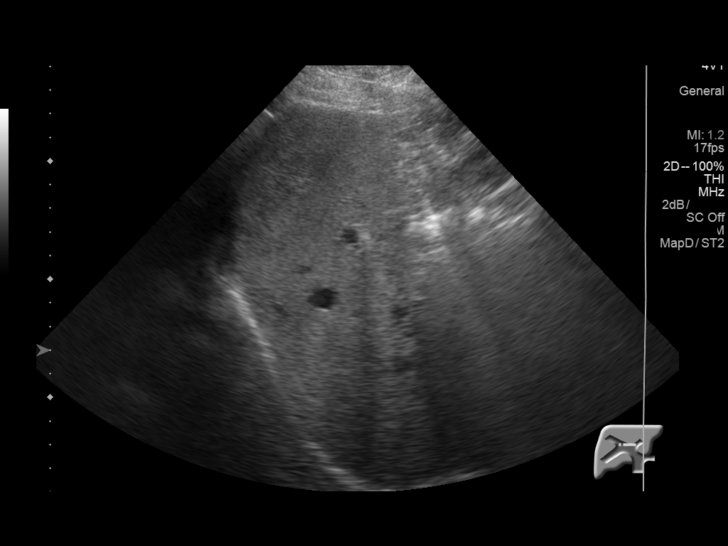
[im 54/59]
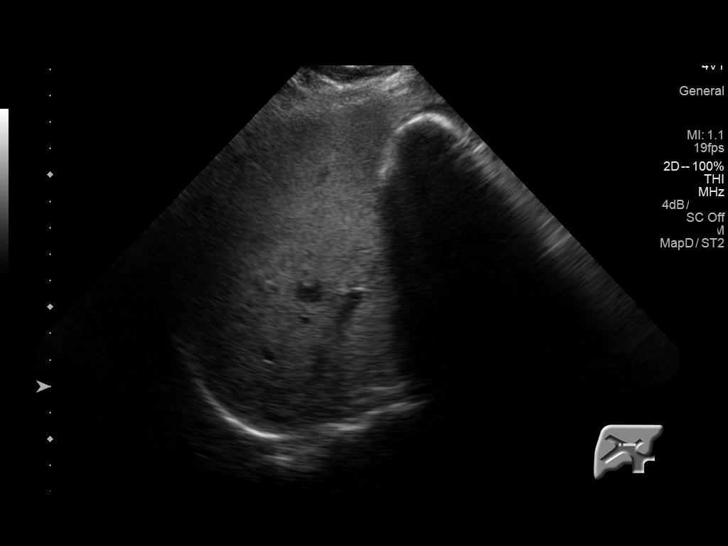
[im 59/59]
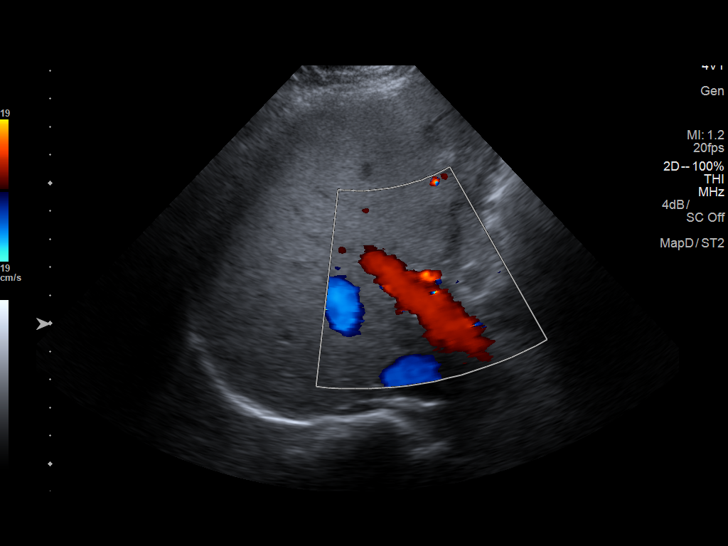

[14 of 25 positions shown; findings below may reference images not displayed]

FINDINGS: Gallbladder:

A large stone measuring 3.4 cm is again seen in the gallbladder.
There is some sludge within the gallbladder. No gallbladder wall
thickening or pericholecystic fluid. Sonographer reports negative
Murphy's sign.

Common bile duct:

Diameter: 0.5 cm

Liver:

No focal lesion. Fatty infiltration noted. Portal vein is patent on
color Doppler imaging with normal direction of blood flow towards
the liver.
IMPRESSION: Large gallstone measuring 3.4 cm in gallbladder sludge without
evidence of acute cholecystitis.

Fatty infiltration of the liver.

## 2019-06-04 ENCOUNTER — Encounter: Payer: Self-pay | Admitting: Family Medicine

## 2019-06-04 ENCOUNTER — Other Ambulatory Visit: Payer: Self-pay

## 2019-06-04 ENCOUNTER — Ambulatory Visit: Payer: Self-pay | Admitting: Family Medicine

## 2019-06-04 ENCOUNTER — Ambulatory Visit: Payer: Self-pay | Attending: Family Medicine | Admitting: Family Medicine

## 2019-06-04 VITALS — BP 118/85 | HR 75 | Temp 97.7°F | Ht 61.0 in | Wt 143.2 lb

## 2019-06-04 DIAGNOSIS — E7800 Pure hypercholesterolemia, unspecified: Secondary | ICD-10-CM

## 2019-06-04 DIAGNOSIS — R739 Hyperglycemia, unspecified: Secondary | ICD-10-CM

## 2019-06-04 DIAGNOSIS — A630 Anogenital (venereal) warts: Secondary | ICD-10-CM | POA: Insufficient documentation

## 2019-06-04 DIAGNOSIS — Z87898 Personal history of other specified conditions: Secondary | ICD-10-CM

## 2019-06-04 DIAGNOSIS — R7309 Other abnormal glucose: Secondary | ICD-10-CM

## 2019-06-04 DIAGNOSIS — Z79899 Other long term (current) drug therapy: Secondary | ICD-10-CM

## 2019-06-04 DIAGNOSIS — R42 Dizziness and giddiness: Secondary | ICD-10-CM

## 2019-06-04 DIAGNOSIS — R519 Headache, unspecified: Secondary | ICD-10-CM

## 2019-06-04 DIAGNOSIS — R002 Palpitations: Secondary | ICD-10-CM

## 2019-06-04 MED ORDER — ATORVASTATIN CALCIUM 10 MG PO TABS: 10.0000 mg | ORAL_TABLET | Freq: Every day | ORAL | 3 refills | Status: DC

## 2019-06-04 MED ORDER — LORATADINE 10 MG PO TABS: 10.0000 mg | ORAL_TABLET | Freq: Every day | ORAL | 3 refills | Status: DC

## 2019-06-04 NOTE — Progress Notes (Signed)
Est care  Dizziness  Headaches

## 2019-06-04 NOTE — Progress Notes (Signed)
Subjective:  Patient ID: Gabriella Schultz, female    DOB: 1980-02-07  Age: 39 y.o. MRN: [id number]  CC: Establish care; dizziness and headache- Cadience Bradfield, MD  Due to a language barrier, [employer] video interpretation system used at today's visit  HPI Gabriella Schultz, 39 yo Hispanic female, who presents to establish care. She is status post ED visit on 04/25/2019 after onset of dizziness and feeling as if she would faint.  She was treated with IV fluids and meclizine with resolution of symptoms.  She has had about 4 weeks of recurrent dizziness and frontal headaches- dull. Dizziness occurs with changes in position, walking and she feels off-balance. She was told by someone that her dizziness was due to her blood pressure being low and she has tried wearing support hose at work which has helped and she has also increased her water intake and her dizziness has improved. She also has had some occasional palpitations but no chest pain or increased SOB. She has had some nasal congestion as well as recurrent dull frontal headache. Headache is sometime worse when she leans forward/has her head bent.   Past Medical History:  Diagnosis Date  . HPV (human papilloma virus) infection 2007    Past Surgical History:  Procedure Laterality Date  . CESAREAN SECTION    . CHOLECYSTECTOMY N/A 03/27/2017   Procedure: LAPAROSCOPIC CHOLECYSTECTOMY WITH INTRAOPERATIVE CHOLANGIOGRAM;  Surgeon: Excell Seltzer, MD;  Location: [facility];  Service: General;  Laterality: N/A;    Family History  Problem Relation Age of Onset  . Hyperlipidemia Mother   . Hyperlipidemia Sister     Social History   Tobacco Use  . Smoking status: Never Smoker  . Smokeless tobacco: Never Used  Substance Use Topics  . Alcohol use: Yes    Comment: ocassional    ROS Review of Systems  Constitutional: Positive for fatigue (improved). Negative for chills and fever.  HENT: Positive for congestion. Negative for sore throat  and trouble swallowing.   Eyes: Negative for photophobia and visual disturbance.  Respiratory: Negative for cough and shortness of breath.   Cardiovascular: Positive for palpitations. Negative for chest pain.  Gastrointestinal: Negative for abdominal pain, blood in stool, constipation, diarrhea and nausea.  Endocrine: Negative for polydipsia, polyphagia and polyuria.  Genitourinary: Negative for dysuria and frequency.  Musculoskeletal: Negative for arthralgias and back pain.  Skin: Negative for rash and wound.  Neurological: Positive for dizziness (improving) and headaches (dull, frontal).  Hematological: Negative for adenopathy. Does not bruise/bleed easily.  Psychiatric/Behavioral: Negative for self-injury and suicidal ideas.    Objective:   Today's Vitals: BP 118/85   Pulse 75   Temp 97.7 F (36.5 C) (Temporal)   Ht 5\' 1"  (1.549 m)   Wt 143 lb 3.2 oz (65 kg)   LMP 05/09/2019   SpO2 95%   BMI 27.06 kg/m   Physical Exam Vitals and nursing note reviewed.  Constitutional:      General: She is not in acute distress.    Appearance: Normal appearance.  HENT:     Right Ear: Ear canal and external ear normal.     Left Ear: Ear canal and external ear normal.     Ears:     Comments: TM's dull bilaterally    Nose: Congestion and rhinorrhea present.     Mouth/Throat:     Pharynx: Posterior oropharyngeal erythema present. No oropharyngeal exudate.     Comments: Mild posterior pharynx erythema and cobblestoning Neck:     Vascular: No carotid  bruit.  Cardiovascular:     Rate and Rhythm: Normal rate and regular rhythm.  Pulmonary:     Effort: Pulmonary effort is normal.     Breath sounds: Normal breath sounds.  Abdominal:     Palpations: Abdomen is soft.     Tenderness: There is no abdominal tenderness. There is no right CVA tenderness, left CVA tenderness, guarding or rebound.  Musculoskeletal:        General: No tenderness or deformity.     Cervical back: Normal range of  motion and neck supple. No rigidity or tenderness.     Right lower leg: No edema.     Left lower leg: No edema.  Lymphadenopathy:     Cervical: No cervical adenopathy.  Skin:    General: Skin is warm and dry.  Neurological:     General: No focal deficit present.     Mental Status: She is alert and oriented to person, place, and time.     Cranial Nerves: No cranial nerve deficit.  Psychiatric:        Mood and Affect: Mood normal.        Behavior: Behavior normal.     Assessment & Plan:   1. Palpitations Patient with complaint of palpitations and dizziness. EKG at today's visit was normal. Will check for anemia, electrolyte abnormality as well as thyroid disorder as possible causes of her palpitations. Consider referral to cardiology if labs normal and continued palpitations. Rest, remain hydrated and avoid caffeine/stimulants. Notes from patient's ED visit on 04/25/2019 reviewed and discussed with the patient.  - EKG 12-Lead - CBC - Basic Metabolic Panel - T4 AND TSH  2. Dizziness She has had some improvement in dizziness by increasing her water intake and using compression stockings. She also on exam has evidence of allergic rhinitis and RX provided for loratadine as allergic rhinitis may be contributing to her dizziness. Will also check CBC to look for anemia, and BMP to look for electrolyte abnormality as a cause of her dizziness.  Educational information in Spanish on dizziness provided as part of after visit summary. - CBC - Basic Metabolic Panel - loratadine (CLARITIN) 10 MG tablet; Take 1 tablet (10 mg total) by mouth daily. To help with dizziness/frontal headache  Dispense: 30 tablet; Refill: 3  3. History of prediabetes; 6. Elevated random glucose level She has a remote history of pre-diabetes with A1c of 6.2 in Feb of 2016 on review of chart. She denies any current symptoms related to DM other than recent dizziness. Glucose on 04/25/2019 at ED visit was 165. Will check Hgb  A1C at today's visit.  Educational material in Spanish provided on preventing type 2 diabetes. - HgB A1c  4. Pure hypercholesterolemia;  7. Encounter for long term use of medications She reports that she has a history of hyperlipidemia for which she has been taking lipitor/atorvastatin. New RX provided and LFT's in follow-up.  Information on hypercholesterolemia provided in Spanish as part of AVS - atorvastatin (LIPITOR) 10 MG tablet; Take 1 tablet (10 mg total) by mouth daily. In the evening to lower cholesterol  Dispense: 30 tablet; Refill: 3 - hepatic function panel  5. Frontal headache Discussed with patient that I suspect that her frontal headache may be related to nasal congestion/allergic rhinitis. RX will be provided for her to try loratadine 10 mg once per day to see if this helps with her nasal congestion and frontal headache.  - loratadine (CLARITIN) 10 MG tablet; Take 1 tablet (10 mg  total) by mouth daily. To help with dizziness/frontal headache  Dispense: 30 tablet; Refill: 3  8. Language barrier Video interpretation system used at today's visit to help with language barrier   Outpatient Encounter Medications as of 06/04/2019  Medication Sig  . acetaminophen (TYLENOL) 325 MG tablet Take 2 tablets (650 mg total) by mouth every 6 (six) hours.  Marland Kitchen atorvastatin (LIPITOR) 10 MG tablet Take 1 tablet (10 mg total) by mouth daily. In the evening to lower cholesterol  . loratadine (CLARITIN) 10 MG tablet Take 1 tablet (10 mg total) by mouth daily. To help with dizziness/frontal headache  . meclizine (ANTIVERT) 25 MG tablet Take 1 tablet (25 mg total) by mouth 3 (three) times daily as needed for dizziness. (Patient not taking: Reported on 06/04/2019)  . ranitidine (ZANTAC) 300 MG tablet Take 1 tablet (300 mg total) by mouth at bedtime. (Patient not taking: Reported on 08/19/2014)  . traMADol (ULTRAM) 50 MG tablet Take 1 tablet (50 mg total) by mouth every 6 (six) hours as needed for moderate  pain. (Patient not taking: Reported on 06/04/2019)   No facility-administered encounter medications on file as of 06/04/2019.    An After Visit Summary was printed and given to the patient.   Follow-up: Return in about 3 weeks (around 06/25/2019) for Dizziness/headaches. Follow-up sooner if needed or ED if any acute worsening of symptoms More than 30 minutes of face to face time spent with patient at today's visit as well as additional time of 12 or more minutes pre and post visit for review of chart and completion of today's visit note.  Cain Saupe MD

## 2019-06-04 NOTE — Patient Instructions (Signed)
Mareos Dizziness Los mareos son un problema muy frecuente. Causan sensacin de inestabilidad o de desvanecimiento. Puede sentir que se va a desmayar. Los [employer] pueden provocarle una lesin si se tropieza o se cae. La causa puede deberse a [employer], tales como los siguientes:  Medicamentos.  No tener suficiente agua en el cuerpo (deshidratacin).  Enfermedad. Siga estas indicaciones en su casa: Comida y bebida   Beba suficiente lquido para mantener el pis (orina) claro o de color amarillo plido. Esto evita la deshidratacin. Trate de beber ms lquidos transparentes, como agua.  No beba alcohol.  Limite la cantidad de cafena que bebe o come si el mdico se lo indica.  Limite la cantidad de sal (sodio) que bebe o come si el mdico se lo indica. Actividad   Evite los movimientos rpidos. ? Cuando se levante de una silla, sujtese hasta sentirse bien. ? Por la maana, sintese primero a un lado de la cama. Cuando se sienta bien, pngase lentamente de pie mientras se sostiene de algo. Haga esto hasta que se sienta seguro en cuanto al equilibrio.  Mueva las piernas con frecuencia si debe estar de pie en un lugar durante mucho tiempo. Mientras est de pie, contraiga y relaje los msculos de las piernas.  No conduzca vehculos ni opere maquinaria pesada si se siente mareado.  Evite agacharse si se siente mareado. En su casa, coloque los objetos en algn lugar que le resulte fcil alcanzarlos sin agacharse. Estilo de vida  No consuma ningn producto que contenga nicotina o tabaco, como cigarrillos y Psychologist, sport and exercise. Si necesita ayuda para dejar de fumar, consulte al mdico.  Intente bajar el nivel de estrs. Para hacerlo, puede usar mtodos como el yoga o la meditacin. Hable con el mdico si necesita ayuda. Instrucciones generales  Controle sus mareos para ver si hay cambios.  Tome los medicamentos de venta libre y los recetados solamente como se lo haya indicado  el mdico. Hable con el mdico si cree que la causa de sus mareos es algn medicamento que est tomando.  Infrmele a un amigo o a un familiar si se siente mareado. Pdale a esta persona que llame al mdico si observa cambios en su comportamiento.  Concurra a todas las visitas de control como se lo haya indicado el mdico. Esto es importante. Comunquese con un mdico si:  Los [employer].  Los [employer] o la sensacin de Engineer, petroleum.  Siente malestar estomacal (nuseas).  Tiene problemas para escuchar.  Aparecen nuevos sntomas.  Siente inestabilidad al estar de pie.  Siente que la Development worker, international aid vueltas. Solicite ayuda de inmediato si:  Vomita o tiene heces acuosas (diarrea), y no puede comer o beber nada.  Tiene dificultad para hacer lo siguiente: ? Hablar. ? Caminar. ? Tragar. ? Usar los brazos, las [place] piernas.  Se siente constantemente dbil.  No piensa con claridad o tiene dificultad para armar oraciones. Es posible que un amigo o un familiar adviertan que esto ocurre.  Tiene los siguientes sntomas: ? Tourist information centre manager. ? Dolor en el vientre (abdomen). ? Falta de aire. ? Sudoracin.  Cambios en la visin.  Sangrado.  Dolor de cabeza muy intenso.  Dolor o rigidez en el cuello.  Cristy Hilts. Estos sntomas pueden Sales executive. No espere hasta que los sntomas desaparezcan. Solicite atencin mdica de inmediato. Comunquese con el servicio de emergencias de su localidad (911 en los [place]). No conduzca por sus propios [place]  mareos causan sensacin de inestabilidad o de desvanecimiento. Puede sentir que se va a desmayar.  Beba suficiente lquido para mantener el pis (orina) claro o de color amarillo plido. No beba alcohol.  Evite los movimientos rpidos si se siente mareado.  Controle sus mareos para ver si hay cambios. Esta informacin no tiene Theme park manager el consejo del  mdico. Asegrese de hacerle al mdico cualquier pregunta que tenga. Document Revised: 07/15/2016 Document Reviewed: 07/15/2016 Elsevier Patient Education  2020 Elsevier Inc.  Colesterol elevado High Cholesterol  El colesterol elevado es una afeccin en la que la sangre tiene niveles altos de una sustancia [place], cerosa y parecida a la grasa (colesterol). El organismo humano necesita una pequea cantidad de colesterol. El hgado fabrica todo el colesterol que el organismo necesita. El exceso de colesterol proviene de los alimentos que comemos. La sangre transporta el colesterol desde el hgado a travs de los vasos sanguneos. Si tiene el colesterol elevado, este puede depositarse (formar placas) en las paredes de los vasos sanguneos (arterias). Las IT trainer y la rigidez de las arterias. Las placas de colesterol aumentan el riesgo de sufrir un infarto de miocardio y un accidente cerebrovascular. Trabaje con el mdico para [employer] concentraciones de colesterol en un rango saludable. Qu incrementa el riesgo? Es ms probable que Dietitian en las personas que:  Consumen alimentos con alto contenido de grasa animal (grasa saturada) o colesterol.  Tienen sobrepeso.  No hacen suficiente ejercicio fsico.  Tienen antecedentes familiares de colesterol elevado. Cules son los signos o los sntomas? Esta afeccin no presenta sntomas. Cmo se diagnostica? Esta afeccin podra diagnosticarse a [employer] de [place] de [place].  Si es mayor de 20aos, es posible que el mdico le controle el colesterol cada [id number].  Los controles pueden ser ms frecuentes si ya tuvo el colesterol elevado u otros factores de riesgo de enfermedades cardacas. En el anlisis de sangre de [place], se determina lo siguiente:  El colesterol "malo" (colesterol LDL). Este es el principal tipo de colesterol que causa enfermedades cardacas. El nivel  recomendado de LDL es de menos de100.  El colesterol "bueno" (colesterol HDL). Este tipo ayuda a Health visitor las enfermedades cardacas limpiando las arterias y arrastrando el LDL. El nivel recomendado de HDL es de60 o superior.  Triglicridos. Estos son grasas que el organismo puede Academic librarian o quemar como fuente de [place]. El nivel recomendado de triglicridos es de menos de 150.  Colesterol total. Esta es una medicin de la cantidad total de colesterol en la sangre, que incluye el colesterol LDL, el colesterol HDL y los triglicridos. El valor saludable es de menos de200. Cmo se trata? Esta afeccin se trata con cambios en la dieta y en el estilo de vida, y con medicamentos. Cambios en la [employer], la ingesta de una mayor cantidad de cereales integrales, frutas, verduras, frutos secos y pescado.  Tambin podran incluir la reduccin del consumo de carnes rojas y alimentos con Medical laboratory scientific officer. Cambios en el estilo de vida  Entre ellos, realizar sesiones de ejercicios aerbicos durante, por lo menos, , 3veces por semana. Por ejemplo, caminar, andar en bicicleta y nadar. Los ejercicios aerbicos junto con una dieta sana pueden ayudar a que se Dietitian en un peso saludable.  Los cambios tambin podran incluir dejar de fumar. Medicamentos  Por lo general, se administran medicamentos si con los Tour manager alimentacin y en el estilo de vida no se logra  reducir el colesterol hasta niveles saludables.  El mdico podra recetarle estatinas. Se ha demostrado que las estatinas [employer] niveles de [place], lo que puede reducir el riesgo de Insurance risk surveyor una enfermedad cardaca. Siga estas indicaciones en su casa: Comida y bebida Si se lo indic el mdico:  Coma pollo (sin piel), pescado, ternera, mariscos, pechuga de [place] y cortes de carne roja de pulpa o de lomo.  No coma alimentos fritos ni carnes grasosas, como salchichas y salame.  Coma  muchas frutas, como manzanas.  Coma gran cantidad de verduras, como brcoli, papas y zanahorias.  Coma porotos, guisantes secos y lentejas.  Coma cereales, como cebada, arroz, cuscs y trigo burgol.  Coma pastas sin salsas con crema.  [place], y coma yogures y quesos descremados o semidescremados.  No coma ni beba [employer]el, crema, helado, yemas de huevo ni quesos duros.  No coma margarinas en barra ni untables que contengan grasas trans (que tambin se conocen como aceites parcialmente hidrogenados).  No coma aceites tropicales saturados, como el de coco y el de [place].  No coma tortas, galletas, galletitas ni otros productos horneados que contengan grasas trans.  Instrucciones generales  Haga ejercicio segn las indicaciones del mdico. Aumente la cantidad de ejercicio fsico que realiza mediante actividades como jardinera, salir a Writer o usar las escaleras.  Tome los medicamentos de venta libre y los recetados solamente como se lo haya indicado el mdico.  No consuma ningn producto que contenga nicotina o tabaco, como cigarrillos y Psychologist, sport and exercise. Si necesita ayuda para dejar de fumar, consulte al mdico.  Concurra a todas las visitas de control como se lo haya indicado el mdico. Esto es importante. Comunquese con un mdico si:  Tiene dificultad para seguir una dieta sana o mantener un peso saludable.  Necesita ayuda para comenzar un programa de ejercicios.  Necesita ayuda para dejar de fumar. Solicite ayuda de inmediato si:  Electronics engineer.  Tiene dificultad para respirar. Esta informacin no tiene Marine scientist el consejo del mdico. Asegrese de hacerle al mdico cualquier pregunta que tenga. Document Revised: 04/19/2016 Document Reviewed: 07/12/2015 Elsevier Patient Education  [place].  Preventing Type 2 Diabetes Mellitus Type 2 diabetes (type 2 diabetes mellitus) is a long-term (chronic)  disease that affects blood sugar (glucose) levels. Normally, a hormone called insulin allows glucose to enter cells in the body. The cells use glucose for energy. In type 2 diabetes, one or both of these problems may be present:  The body does not make enough insulin.  The body does not respond properly to insulin that it makes (insulin resistance). Insulin resistance or lack of insulin causes excess glucose to build up in the blood instead of going into cells. As a result, high blood glucose (hyperglycemia) develops, which can cause many complications. Being overweight or obese and having an inactive (sedentary) lifestyle can increase your risk for diabetes. Type 2 diabetes can be delayed or prevented by making certain nutrition and lifestyle changes. What nutrition changes can be made?   Eat healthy meals and snacks regularly. Keep a healthy snack with you for when you get hungry between meals, such as fruit or a handful of nuts.  Eat lean meats and proteins that are low in saturated fats, such as chicken, fish, egg whites, and beans. Avoid processed meats.  Eat plenty of fruits and vegetables and plenty of grains that have not been processed (whole grains). It is recommended that you  eat: ? 1?2 cups of fruit every day. ? 2?3 cups of vegetables every day. ? 6?8 oz of whole grains every day, such as oats, whole wheat, bulgur, brown rice, quinoa, and millet.  Eat low-fat dairy products, such as milk, yogurt, and cheese.  Eat foods that contain healthy fats, such as nuts, avocado, olive oil, and canola oil.  Drink water throughout the day. Avoid drinks that contain added sugar, such as soda or sweet tea.  Follow instructions from your health care provider about specific eating or drinking restrictions.  Control how much food you eat at a time (portion size). ? Check food labels to find out the serving sizes of foods. ? Use a kitchen scale to weigh amounts of foods.  Saute or steam food  instead of frying it. Cook with water or broth instead of oils or butter.  Limit your intake of: ? Salt (sodium). Have no more than 1 tsp (2,400 mg) of sodium a day. If you have heart disease or high blood pressure, have less than ? tsp (1,500 mg) of sodium a day. ? Saturated fat. This is fat that is solid at room temperature, such as butter or fat on meat. What lifestyle changes can be made? Activity   Do moderate-intensity physical activity for at least 30 minutes on at least 5 days of the week, or as much as told by your health care provider.  Ask your health care provider what activities are safe for you. A mix of physical activities may be best, such as walking, swimming, cycling, and strength training.  Try to add physical activity into your day. For example: ? Park in spots that are farther away than usual, so that you walk more. For example, park in a far corner of the parking lot when you go to the office or the grocery store. ? Take a walk during your lunch break. ? Use stairs instead of elevators or escalators. Weight Loss  Lose weight as directed. Your health care provider can determine how much weight loss is best for you and can help you lose weight safely.  If you are overweight or obese, you may be instructed to lose at least 5?7 % of your body weight. Alcohol and Tobacco   Limit alcohol intake to no more than 1 drink a day for nonpregnant women and 2 drinks a day for men. One drink equals 12 oz of beer, 5 oz of wine, or 1 oz of hard liquor.  Do not use any tobacco products, such as cigarettes, chewing tobacco, and e-cigarettes. If you need help quitting, ask your health care provider. Work With Your Health Care Provider  Have your blood glucose tested regularly, as told by your health care provider.  Discuss your risk factors and how you can reduce your risk for diabetes.  Get screening tests as told by your health care provider. You may have screening tests  regularly, especially if you have certain risk factors for type 2 diabetes.  Make an appointment with a diet and nutrition specialist (registered dietitian). A registered dietitian can help you make a healthy eating plan and can help you understand portion sizes and food labels. Why are these changes important?  It is possible to prevent or delay type 2 diabetes and related health problems by making lifestyle and nutrition changes.  It can be difficult to recognize signs of type 2 diabetes. The best way to avoid possible damage to your body is to take actions to prevent the  disease before you develop symptoms. What can happen if changes are not made?  Your blood glucose levels may keep increasing. Having high blood glucose for a long time is dangerous. Too much glucose in your blood can damage your blood vessels, heart, kidneys, nerves, and eyes.  You may develop prediabetes or type 2 diabetes. Type 2 diabetes can lead to many chronic health problems and complications, such as: ? Heart disease. ? Stroke. ? Blindness. ? Kidney disease. ? Depression. ? Poor circulation in the feet and legs, which could lead to surgical removal (amputation) in severe cases. Where to find support  Ask your health care provider to recommend a registered dietitian, diabetes educator, or weight loss program.  Look for local or online weight loss groups.  Join a gym, fitness club, or outdoor activity group, such as a walking club. Where to find more information To learn more about diabetes and diabetes prevention, visit:  American Diabetes Association (ADA): www.diabetes.[employer] of Diabetes and Digestive and Kidney Diseases: ToyArticles.ca To learn more about healthy eating, visit:  The U.S. Department of Agriculture Architect), Choose My Plate: http://yates.biz/  Office of Disease Prevention and Health Promotion (ODPHP), Dietary Guidelines:  ListingMagazine.si Summary  You can reduce your risk for type 2 diabetes by increasing your physical activity, eating healthy foods, and losing weight as directed.  Talk with your health care provider about your risk for type 2 diabetes. Ask about any blood tests or screening tests that you need to have. This information is not intended to replace advice given to you by your health care provider. Make sure you discuss any questions you have with your health care provider. Document Revised: 05/05/2018 Document Reviewed: 03/04/2015 Elsevier Patient Education  2020 [employer].

## 2019-06-05 LAB — CBC
Hematocrit: 38.6 % (ref 34.0–46.6)
Hemoglobin: 12.4 g/dL (ref 11.1–15.9)
MCH: 28.1 pg (ref 26.6–33.0)
MCHC: 32.1 g/dL (ref 31.5–35.7)
MCV: 88 fL (ref 79–97)
Platelets: 287 x10E3/uL (ref 150–450)
RBC: 4.41 x10E6/uL (ref 3.77–5.28)
RDW: 14.7 % (ref 11.7–15.4)
WBC: 9.3 x10E3/uL (ref 3.4–10.8)

## 2019-06-05 LAB — BASIC METABOLIC PANEL WITH GFR
BUN/Creatinine Ratio: 16 (ref 9–23)
BUN: 12 mg/dL (ref 6–20)
CO2: 24 mmol/L (ref 20–29)
Calcium: 9.2 mg/dL (ref 8.7–10.2)
Chloride: 105 mmol/L (ref 96–106)
Creatinine, Ser: 0.73 mg/dL (ref 0.57–1.00)
GFR calc Af Amer: 122 mL/min/1.73
GFR calc non Af Amer: 106 mL/min/1.73
Glucose: 83 mg/dL (ref 65–99)
Potassium: 4.1 mmol/L (ref 3.5–5.2)
Sodium: 141 mmol/L (ref 134–144)

## 2019-06-05 LAB — T4 AND TSH
T4, Total: 7.1 ug/dL (ref 4.5–12.0)
TSH: 1.04 u[IU]/mL (ref 0.450–4.500)

## 2019-06-06 ENCOUNTER — Ambulatory Visit: Payer: Self-pay | Attending: Family Medicine

## 2019-06-06 ENCOUNTER — Ambulatory Visit: Payer: Self-pay

## 2019-06-06 ENCOUNTER — Other Ambulatory Visit: Payer: Self-pay

## 2019-06-06 VITALS — BP 117/76 | HR 66 | Temp 97.6°F | Resp 16

## 2019-06-06 DIAGNOSIS — Z79899 Other long term (current) drug therapy: Secondary | ICD-10-CM

## 2019-06-06 DIAGNOSIS — E7800 Pure hypercholesterolemia, unspecified: Secondary | ICD-10-CM

## 2019-06-06 DIAGNOSIS — R42 Dizziness and giddiness: Secondary | ICD-10-CM

## 2019-06-06 NOTE — Progress Notes (Signed)
  Patient is here refferd by PCP for BP check   Denies any dizziness or any distress at this time. Readings recorded  today are: Manual reading  L arm BP117/76 P  66 O 99% T 97.6

## 2019-06-08 LAB — HEPATIC FUNCTION PANEL
ALT: 23 IU/L (ref 0–32)
AST: 19 IU/L (ref 0–40)
Albumin: 4.4 g/dL (ref 3.8–4.8)
Alkaline Phosphatase: 52 IU/L (ref 39–117)
Bilirubin Total: <0.2 mg/dL (ref 0.0–1.2)
Bilirubin, Direct: 0.05 mg/dL (ref 0.00–0.40)
Total Protein: 7 g/dL (ref 6.0–8.5)

## 2019-06-08 LAB — HEMOGLOBIN A1C
Est. average glucose Bld gHb Est-mCnc: 134 mg/dL
Hgb A1c MFr Bld: 6.3 % — ABNORMAL HIGH (ref 4.8–5.6)

## 2019-06-08 LAB — SPECIMEN STATUS REPORT

## 2019-06-13 ENCOUNTER — Ambulatory Visit: Payer: Self-pay | Admitting: Family Medicine

## 2019-06-20 ENCOUNTER — Ambulatory Visit: Payer: Self-pay | Admitting: Family Medicine

## 2019-06-29 ENCOUNTER — Ambulatory Visit: Payer: Self-pay | Attending: Family | Admitting: Family

## 2019-06-29 ENCOUNTER — Encounter: Payer: Self-pay | Admitting: Family

## 2019-06-29 ENCOUNTER — Other Ambulatory Visit: Payer: Self-pay

## 2019-06-29 VITALS — BP 117/82 | HR 67 | Temp 97.9°F | Resp 16 | Wt 143.4 lb

## 2019-06-29 DIAGNOSIS — Z114 Encounter for screening for human immunodeficiency virus [HIV]: Secondary | ICD-10-CM

## 2019-06-29 DIAGNOSIS — Z124 Encounter for screening for malignant neoplasm of cervix: Secondary | ICD-10-CM

## 2019-06-29 NOTE — Progress Notes (Signed)
Patient ID: Gabriella Schultz, female    DOB: 08-Jan-1981  MRN: [id number]  CC: Gynecologic Exam  Subjective: Gabriella Schultz is a 39 y.o. female with history of gastritis, carpal tunnel syndrome, and elevated hemoglobin A1C who presents for gynecologic exam.   1. PAP SMEAR: Age at menarche: 39 years old LMP: 06/07/2019 Menses duration, flow, frequency: 6 days, heavy flow, period happens once monthly Pregnancies: 1 Births: 1 Terminated pregnancies: 1 Sexual concerns: denies Sexually active: yes, 1 partner Contraception use past, present: denies Previous STIs: denies Previous PID: denies Vaginal discharge: denies Vaginal lesions: denies Pelvic pain: denies Uterine fibroids: denies Urinary incontinence: denies Bowel incontinence: denies Previous GYN procedures: denies Last PAP: 2016  Results of last PAP: normal Testing for STIs today? Yes Testing for HIV today? Yes   Comments: Reports she has been trying to conceive for over 1 year without success.  Patient Active Problem List   Diagnosis Date Noted  . Acute on chronic cholecystitis s/p lap cholecystectomy 03/27/2017 03/26/2017  . Carpal tunnel syndrome 08/19/2014  . Family history of hyperlipidemia 03/18/2014  . Enlarged tonsils 03/18/2014  . Pap smear for cervical cancer screening 03/18/2014  . Elevated hemoglobin A1c 03/18/2014  . GASTRITIS 06/22/2007     Current Outpatient Medications on File Prior to Visit  Medication Sig Dispense Refill  . acetaminophen (TYLENOL) 325 MG tablet Take 2 tablets (650 mg total) by mouth every 6 (six) hours.    Marland Kitchen atorvastatin (LIPITOR) 10 MG tablet Take 1 tablet (10 mg total) by mouth daily. In the evening to lower cholesterol 30 tablet 3  . loratadine (CLARITIN) 10 MG tablet Take 1 tablet (10 mg total) by mouth daily. To help with dizziness/frontal headache 30 tablet 3  . meclizine (ANTIVERT) 25 MG tablet Take 1 tablet (25 mg total) by mouth 3 (three) times daily as needed for  dizziness. (Patient not taking: Reported on 06/04/2019) 30 tablet 0  . ranitidine (ZANTAC) 300 MG tablet Take 1 tablet (300 mg total) by mouth at bedtime. (Patient not taking: Reported on 08/19/2014) 90 tablet 1  . traMADol (ULTRAM) 50 MG tablet Take 1 tablet (50 mg total) by mouth every 6 (six) hours as needed for moderate pain. (Patient not taking: Reported on 06/04/2019) 30 tablet 0   No current facility-administered medications on file prior to visit.    No Known Allergies  Social History   Socioeconomic History  . Marital status: Married    Spouse name: Not on file  . Number of children: Not on file  . Years of education: Not on file  . Highest education level: Not on file  Occupational History  . Not on file  Tobacco Use  . Smoking status: Never Smoker  . Smokeless tobacco: Never Used  Substance and Sexual Activity  . Alcohol use: Yes    Comment: ocassional  . Drug use: No  . Sexual activity: Not on file  Other Topics Concern  . Not on file  Social History Narrative  . Not on file   Social Determinants of Health   Financial Resource Strain:   . Difficulty of Paying Living Expenses:   Food Insecurity:   . Worried About Programme researcher, broadcasting/film/video in the Last Year:   . Barista in the Last Year:   Transportation Needs:   . Freight forwarder (Medical):   Marland Kitchen Lack of Transportation (Non-Medical):   Physical Activity:   . Days of Exercise per Week:   . Minutes of  Exercise per Session:   Stress:   . Feeling of Stress :   Social Connections:   . Frequency of Communication with Friends and Family:   . Frequency of Social Gatherings with Friends and Family:   . Attends Religious Services:   . Active Member of Clubs or Organizations:   . Attends Archivist Meetings:   Marland Kitchen Marital Status:   Intimate Partner Violence:   . Fear of Current or Ex-Partner:   . Emotionally Abused:   Marland Kitchen Physically Abused:   . Sexually Abused:     Family History  Problem  Relation Age of Onset  . Hyperlipidemia Mother   . Hyperlipidemia Sister     Past Surgical History:  Procedure Laterality Date  . CESAREAN SECTION    . CHOLECYSTECTOMY N/A 03/27/2017   Procedure: LAPAROSCOPIC CHOLECYSTECTOMY WITH INTRAOPERATIVE CHOLANGIOGRAM;  Surgeon: Excell Seltzer, MD;  Location: [facility];  Service: General;  Laterality: N/A;    ROS: Review of Systems Negative except as stated above  PHYSICAL EXAM: BP 117/82   Pulse 67   Temp 97.9 F (36.6 C)   Resp 16   Wt 143 lb 6.4 oz (65 kg)   SpO2 98%   BMI 27.10 kg/m   Physical Exam General appearance - alert, well appearing, and in no distress and oriented to person, place, and time Mental status - alert, oriented to person, place, and time, normal mood, behavior, speech, dress, motor activity, and thought processes Neck - supple, no significant adenopathy Lymphatics - no palpable lymphadenopathy, no hepatosplenomegaly Chest - clear to auscultation, no wheezes, rales or rhonchi, symmetric air entry, no tachypnea, retractions or cyanosis Heart - normal rate, regular rhythm, normal S1, S2, no murmurs, rubs, clicks or gallops Breasts - breasts appear normal, no suspicious masses, no skin or nipple changes or axillary nodes, chaperoned by Orlan Leavens, CMA Pelvic - normal external genitalia, vulva, vagina, cervix, uterus and adnexa, exam chaperoned by Orlan Leavens, CMA  ASSESSMENT AND PLAN: 1. Pap smear for cervical cancer screening: -PAP completed today for cervical cancer screening.  - Cervicovaginal ancillary only - Cytology - PAP  2. Encounter for screening for HIV: -Lab for screening of HIV. - HIV antibody (with reflex)  Patient was given the opportunity to ask questions.  Patient verbalized understanding of the plan and was able to repeat key elements of the plan.   Camillia Herter, NP

## 2019-06-29 NOTE — Patient Instructions (Signed)
PAP today will call with results.  PAP hoy llamar con resultados.  Pap Test Why am I having this test? A Pap test, also called a Pap smear, is a screening test to check for signs of:  Cancer of the vagina, cervix, and uterus. The cervix is the lower part of the uterus that opens into the vagina.  Infection.  Changes that may be a sign that cancer is developing (precancerous changes). Women need this test on a regular basis. In general, you should have a Pap test every 3 years until you reach menopause or age 58. Women aged 30-60 may choose to have their Pap test done at the same time as an HPV (human papillomavirus) test every 5 years (instead of every 3 years). Your health care provider may recommend having Pap tests more or less often depending on your medical conditions and past Pap test results. What kind of sample is taken?  Your health care provider will collect a sample of cells from the surface of your cervix. This will be done using a small cotton swab, plastic spatula, or brush. This sample is often collected during a pelvic exam, when you are lying on your back on an exam table with feet in footrests (stirrups). In some cases, fluids (secretions) from the cervix or vagina may also be collected. How do I prepare for this test?  Be aware of where you are in your menstrual cycle. If you are menstruating on the day of the test, you may be asked to reschedule.  You may need to reschedule if you have a known vaginal infection on the day of the test.  Follow instructions from your health care provider about: ? Changing or stopping your regular medicines. Some medicines can cause abnormal test results, such as digitalis and tetracycline. ? Avoiding douching or taking a bath the day before or the day of the test. Tell a health care provider about:  Any allergies you have.  All medicines you are taking, including vitamins, herbs, eye drops, creams, and over-the-counter  medicines.  Any blood disorders you have.  Any surgeries you have had.  Any medical conditions you have.  Whether you are pregnant or may be pregnant. How are the results reported? Your test results will be reported as either abnormal or normal. A false-positive result can occur. A false positive is incorrect because it means that a condition is present when it is not. A false-negative result can occur. A false negative is incorrect because it means that a condition is not present when it is. What do the results mean? A normal test result means that you do not have signs of cancer of the vagina, cervix, or uterus. An abnormal result may mean that you have:  Cancer. A Pap test by itself is not enough to diagnose cancer. You will have more tests done in this case.  Precancerous changes in your vagina, cervix, or uterus.  Inflammation of the cervix.  An STD (sexually transmitted disease).  A fungal infection.  A parasite infection. Talk with your health care provider about what your results mean. Questions to ask your health care provider Ask your health care provider, or the department that is doing the test:  When will my results be ready?  How will I get my results?  What are my treatment options?  What other tests do I need?  What are my next steps? Summary  In general, women should have a Pap test every 3 years until they  reach menopause or age 81.  Your health care provider will collect a sample of cells from the surface of your cervix. This will be done using a small cotton swab, plastic spatula, or brush.  In some cases, fluids (secretions) from the cervix or vagina may also be collected. This information is not intended to replace advice given to you by your health care provider. Make sure you discuss any questions you have with your health care provider. Document Revised: 09/20/2016 Document Reviewed: 09/20/2016 Elsevier Patient Education  [place].

## 2019-06-30 LAB — HIV ANTIBODY (ROUTINE TESTING W REFLEX): HIV Screen 4th Generation wRfx: NONREACTIVE

## 2019-06-30 NOTE — Progress Notes (Signed)
HIV negative.   PAP pending.

## 2019-07-02 LAB — CERVICOVAGINAL ANCILLARY ONLY
Bacterial Vaginitis (gardnerella): NEGATIVE
Candida Glabrata: NEGATIVE
Candida Vaginitis: NEGATIVE
Chlamydia: NEGATIVE
Comment: NEGATIVE
Comment: NEGATIVE
Comment: NEGATIVE
Comment: NEGATIVE
Comment: NEGATIVE
Comment: NORMAL
Neisseria Gonorrhea: NEGATIVE
Trichomonas: NEGATIVE

## 2019-07-03 LAB — CYTOLOGY - PAP
Adequacy: ABSENT
Comment: NEGATIVE
Diagnosis: NEGATIVE
High risk HPV: NEGATIVE

## 2019-07-05 NOTE — Progress Notes (Signed)
Please call patient with update.   PAP negative for lesions and malignancy.   The transformation zone is absent. The transformation zone is the most common place for abnormal cells to develop.   However, in accordance to the American Society for Colposcopy and Cervical Pathology (ASCCP) guidelines patient may keep regular PAP schedule of every 3 to 5 years.   Follow-up with primary physician as needed.

## 2019-07-06 ENCOUNTER — Telehealth: Payer: Self-pay

## 2019-07-06 NOTE — Telephone Encounter (Signed)
[employer] [place]  Id#  [id number] contacted pt to go over pap results pt didn't answer left a detailed vm informing pt of results and if she has any questions or concerns to give a call

## 2019-07-25 ENCOUNTER — Other Ambulatory Visit: Payer: Self-pay | Admitting: Family Medicine

## 2019-07-25 DIAGNOSIS — R519 Headache, unspecified: Secondary | ICD-10-CM

## 2019-07-25 DIAGNOSIS — R42 Dizziness and giddiness: Secondary | ICD-10-CM

## 2019-07-25 DIAGNOSIS — E7800 Pure hypercholesterolemia, unspecified: Secondary | ICD-10-CM

## 2019-07-25 NOTE — Telephone Encounter (Signed)
Requested Prescriptions  Pending Prescriptions Disp Refills  . loratadine (CLARITIN) 10 MG tablet [Pharmacy Med Name: LORATADINE 10 MG TABLET] 90 tablet 0    Sig: TAKE 1 TABLET (10 MG TOTAL) BY MOUTH DAILY. TO HELP WITH DIZZINESS/FRONTAL HEADACHE     Ear, Nose, and Throat:  Antihistamines Passed - 07/25/2019  2:31 PM      Passed - Valid encounter within last 12 months    Recent Outpatient Visits          3 weeks ago Pap smear for cervical cancer screening   [facility] [place], [place], [place]   1 month ago Palpitations   [facility] [place], [place], MD   4 years ago Carpal tunnel syndrome of right wrist   [facility] [place], Odette Horns, MD   5 years ago Diabetes mellitus screening    [facility] [place], [place]   5 years ago Enlarged tonsils   [facility], Harrel Lemon, MD             . atorvastatin (LIPITOR) 10 MG tablet [Pharmacy Med Name: ATORVASTATIN 10 MG TABLET] 90 tablet 0    Sig: TAKE 1 TABLET (10 MG TOTAL) BY MOUTH DAILY. IN THE EVENING TO LOWER CHOLESTEROL     Cardiovascular:  Antilipid - Statins Failed - 07/25/2019  2:31 PM      Failed - Total Cholesterol in normal range and within 360 days    Cholesterol  Date Value Ref Range Status  03/20/2014 220 (H) 0 - 200 mg/dL Final    Comment:    ATP III Classification:       < 200        mg/dL        Desirable      202 - 239     mg/dL        Borderline High      >= 240        mg/dL        High            Failed - LDL in normal range and within 360 days    LDL Cholesterol  Date Value Ref Range Status  03/20/2014 150 (H) 0 - 99 mg/dL Final    Comment:      Total Cholesterol/HDL Ratio:CHD Risk                        Coronary Heart Disease Risk Table                                        Men       Women          1/2 Average Risk              3.4        3.3              Average Risk               5.0        4.4           2X Average Risk              9.6        7.1  3X Average Risk             23.4       11.0 Use the calculated Patient Ratio above and the CHD Risk table  to determine the patient's CHD Risk. ATP III Classification (LDL):       < 100        mg/dL         Optimal      062 - 129     mg/dL         Near or Above Optimal      130 - 159     mg/dL         Borderline High      160 - 189     mg/dL         High       > 694        mg/dL         Very High            Failed - HDL in normal range and within 360 days    HDL  Date Value Ref Range Status  03/20/2014 51 >=46 mg/dL Final    Comment:    ** Please note change in reference range(s). **         Failed - Triglycerides in normal range and within 360 days    Triglycerides  Date Value Ref Range Status  03/20/2014 95 <150 mg/dL Final         Passed - Patient is not pregnant      Passed - Valid encounter within last 12 months    Recent Outpatient Visits          3 weeks ago Pap smear for cervical cancer screening   [facility] [place], [place], NP   1 month ago Palpitations   [facility] [place], [place], MD   4 years ago Carpal tunnel syndrome of right wrist   [facility] [place], Odette Horns, MD   5 years ago Diabetes mellitus screening   [facility] [place], MD   5 years ago Enlarged tonsils   [facility], Harrel Lemon, MD
